# Patient Record
Sex: Female | Born: 1955 | Race: Black or African American | Hispanic: No | Marital: Single | Smoking: Never smoker
Health system: Southern US, Community
[De-identification: ages and names within clinical notes are randomized; demographics above are authoritative.]

## PROBLEM LIST (undated history)

## (undated) DIAGNOSIS — E119 Type 2 diabetes mellitus without complications: Secondary | ICD-10-CM

## (undated) DIAGNOSIS — I10 Essential (primary) hypertension: Secondary | ICD-10-CM

## (undated) HISTORY — PX: ABDOMINAL HYSTERECTOMY: SHX81

## (undated) HISTORY — PX: CERVICAL SPINE SURGERY: SHX589

---

## 1998-01-07 ENCOUNTER — Emergency Department (HOSPITAL_COMMUNITY): Admission: EM | Admit: 1998-01-07 | Discharge: 1998-01-07 | Payer: Self-pay | Admitting: Emergency Medicine

## 1999-04-14 ENCOUNTER — Ambulatory Visit (HOSPITAL_COMMUNITY): Admission: RE | Admit: 1999-04-14 | Discharge: 1999-04-14 | Payer: Self-pay | Admitting: Neurosurgery

## 1999-04-14 ENCOUNTER — Encounter: Payer: Self-pay | Admitting: Neurosurgery

## 1999-05-16 ENCOUNTER — Encounter: Admission: RE | Admit: 1999-05-16 | Discharge: 1999-06-19 | Payer: Self-pay | Admitting: Neurological Surgery

## 1999-06-02 ENCOUNTER — Encounter: Payer: Self-pay | Admitting: Neurological Surgery

## 1999-06-02 ENCOUNTER — Ambulatory Visit (HOSPITAL_COMMUNITY): Admission: RE | Admit: 1999-06-02 | Discharge: 1999-06-02 | Payer: Self-pay | Admitting: Neurological Surgery

## 1999-06-09 ENCOUNTER — Encounter: Payer: Self-pay | Admitting: Neurological Surgery

## 1999-06-12 ENCOUNTER — Inpatient Hospital Stay (HOSPITAL_COMMUNITY): Admission: RE | Admit: 1999-06-12 | Discharge: 1999-06-13 | Payer: Self-pay | Admitting: Neurological Surgery

## 1999-06-12 ENCOUNTER — Encounter: Payer: Self-pay | Admitting: Neurological Surgery

## 1999-07-05 ENCOUNTER — Encounter: Admission: RE | Admit: 1999-07-05 | Discharge: 1999-07-05 | Payer: Self-pay | Admitting: Neurological Surgery

## 1999-07-05 ENCOUNTER — Encounter: Payer: Self-pay | Admitting: Neurological Surgery

## 1999-09-22 ENCOUNTER — Encounter: Payer: Self-pay | Admitting: Neurological Surgery

## 1999-09-22 ENCOUNTER — Ambulatory Visit (HOSPITAL_COMMUNITY): Admission: RE | Admit: 1999-09-22 | Discharge: 1999-09-22 | Payer: Self-pay | Admitting: Neurological Surgery

## 1999-09-29 ENCOUNTER — Encounter: Admission: RE | Admit: 1999-09-29 | Discharge: 1999-11-02 | Payer: Self-pay | Admitting: Neurological Surgery

## 2000-11-22 ENCOUNTER — Encounter: Payer: Self-pay | Admitting: Neurological Surgery

## 2000-11-22 ENCOUNTER — Encounter: Admission: RE | Admit: 2000-11-22 | Discharge: 2000-11-22 | Payer: Self-pay | Admitting: Neurological Surgery

## 2000-12-16 ENCOUNTER — Encounter: Payer: Self-pay | Admitting: Neurological Surgery

## 2000-12-16 ENCOUNTER — Encounter: Admission: RE | Admit: 2000-12-16 | Discharge: 2000-12-16 | Payer: Self-pay | Admitting: Neurological Surgery

## 2014-10-21 ENCOUNTER — Other Ambulatory Visit: Payer: Self-pay | Admitting: Nurse Practitioner

## 2014-10-21 ENCOUNTER — Ambulatory Visit
Admission: RE | Admit: 2014-10-21 | Discharge: 2014-10-21 | Disposition: A | Discharge status: Home / Self Care | Payer: 59 | Source: Ambulatory Visit | Attending: Nurse Practitioner | Admitting: Nurse Practitioner

## 2014-10-21 DIAGNOSIS — R200 Anesthesia of skin: Secondary | ICD-10-CM

## 2016-01-20 ENCOUNTER — Other Ambulatory Visit: Payer: Self-pay | Admitting: Internal Medicine

## 2016-01-20 DIAGNOSIS — Z1231 Encounter for screening mammogram for malignant neoplasm of breast: Secondary | ICD-10-CM

## 2016-02-14 ENCOUNTER — Ambulatory Visit: Payer: 59

## 2019-07-10 ENCOUNTER — Ambulatory Visit: Payer: Medicare Other | Attending: Internal Medicine

## 2019-07-10 DIAGNOSIS — Z23 Encounter for immunization: Secondary | ICD-10-CM

## 2019-07-10 NOTE — Progress Notes (Signed)
   Covid-19 Vaccination Clinic  Name:  AIGNER HORSEMAN    MRN: [id number] DOB: November 09, 1955  07/10/2019  Ms. Stayer was observed post Covid-19 immunization for 15 minutes without incident. She was provided with Vaccine Information Sheet and instruction to access the V-Safe system.   Ms. Huq was instructed to call 911 with any severe reactions post vaccine: Marland Kitchen Difficulty breathing  . Swelling of face and throat  . A fast heartbeat  . A bad rash all over body  . Dizziness and weakness   Immunizations Administered    Name Date Dose VIS Date Route   Pfizer COVID-19 Vaccine 07/10/2019  5:46 PM 0.3 mL 04/17/2019 Intramuscular   Manufacturer: [employer], [employer]   Lot: [id number]   NDC: [address]-6

## 2019-08-11 ENCOUNTER — Ambulatory Visit: Payer: Medicare Other | Attending: Internal Medicine

## 2019-08-11 DIAGNOSIS — Z23 Encounter for immunization: Secondary | ICD-10-CM

## 2019-08-11 NOTE — Progress Notes (Signed)
   Covid-19 Vaccination Clinic  Name:  ROSE-MARIE HICKLING    MRN: [id number] DOB: 02-05-1956  08/11/2019  Ms. Cross was observed post Covid-19 immunization for 15 minutes without incident. She was provided with Vaccine Information Sheet and instruction to access the V-Safe system.   Ms. Dias was instructed to call 911 with any severe reactions post vaccine: Marland Kitchen Difficulty breathing  . Swelling of face and throat  . A fast heartbeat  . A bad rash all over body  . Dizziness and weakness   Immunizations Administered    Name Date Dose VIS Date Route   Pfizer COVID-19 Vaccine 08/11/2019  3:34 PM 0.3 mL 04/17/2019 Intramuscular   Manufacturer: [employer], [employer]   Lot: [id number]   NDC: [address]-7

## 2020-09-02 ENCOUNTER — Other Ambulatory Visit: Payer: Self-pay | Admitting: Internal Medicine

## 2020-09-02 ENCOUNTER — Other Ambulatory Visit: Payer: Self-pay

## 2020-09-02 ENCOUNTER — Ambulatory Visit
Admission: RE | Admit: 2020-09-02 | Discharge: 2020-09-02 | Disposition: A | Discharge status: Home / Self Care | Payer: 59 | Source: Ambulatory Visit | Attending: Internal Medicine | Admitting: Internal Medicine

## 2020-09-02 DIAGNOSIS — M5416 Radiculopathy, lumbar region: Secondary | ICD-10-CM

## 2020-09-14 ENCOUNTER — Other Ambulatory Visit: Payer: Self-pay | Admitting: Internal Medicine

## 2020-09-14 DIAGNOSIS — M5416 Radiculopathy, lumbar region: Secondary | ICD-10-CM

## 2020-09-25 ENCOUNTER — Other Ambulatory Visit: Payer: Self-pay

## 2020-09-25 ENCOUNTER — Ambulatory Visit
Admission: RE | Admit: 2020-09-25 | Discharge: 2020-09-25 | Disposition: A | Discharge status: Home / Self Care | Payer: 59 | Source: Ambulatory Visit | Attending: Internal Medicine | Admitting: Internal Medicine

## 2020-09-25 DIAGNOSIS — M5416 Radiculopathy, lumbar region: Secondary | ICD-10-CM

## 2020-09-25 MED ORDER — GADOBENATE DIMEGLUMINE 529 MG/ML IV SOLN
15.0000 mL | Freq: Once | INTRAVENOUS | Status: AC | PRN
  Administered 2020-09-25: 15 mL via INTRAVENOUS

## 2020-12-27 ENCOUNTER — Other Ambulatory Visit: Payer: Self-pay | Admitting: Neurological Surgery

## 2021-01-13 NOTE — Pre-Procedure Instructions (Signed)
Surgical Instructions    Your procedure is scheduled on Thursday, January 19, 2021 at 7:30 AM.  Report to [facility] Main Entrance "A" at 5:30 A.M., then check in with the Admitting office.  Call this number if you have problems the morning of surgery:  [phone]   If you have any questions prior to your surgery date call [phone]: Open Monday-Friday 8am-4pm    Remember:  Do not eat after midnight the night before your surgery  You may drink clear liquids until 4:30 AM the morning of your surgery.   Clear liquids allowed are: Water, Non-Citrus Juices (without pulp), Carbonated Beverages, Clear Tea, Black Coffee ONLY (NO MILK, CREAM OR POWDERED CREAMER of any kind), and Gatorade    Take these medicines the morning of surgery with A SIP OF WATER:  acetaminophen (TYLENOL) - if needed   As of today, STOP taking any Aspirin (unless otherwise instructed by your surgeon) Aleve, Naproxen, Ibuprofen, Motrin, Advil, Goody's, BC's, all herbal medications, fish oil, and all vitamins.  WHAT DO I DO ABOUT MY DIABETES MEDICATION?   Do not take SYNJARDY XR  the morning of surgery.   HOW TO MANAGE YOUR DIABETES BEFORE AND AFTER SURGERY  Why is it important to control my blood sugar before and after surgery? Improving blood sugar levels before and after surgery helps healing and can limit problems. A way of improving blood sugar control is eating a healthy diet by:  Eating less sugar and carbohydrates  Increasing activity/exercise  Talking with your doctor about reaching your blood sugar goals High blood sugars (greater than 180 mg/dL) can raise your risk of infections and slow your recovery, so you will need to focus on controlling your diabetes during the weeks before surgery. Make sure that the doctor who takes care of your diabetes knows about your planned surgery including the date and location.  How do I manage my blood sugar before surgery? Check your blood sugar at least 4  times a day, starting 2 days before surgery, to make sure that the level is not too high or low.  Check your blood sugar the morning of your surgery when you wake up and every 2 hours until you get to the Short Stay unit.  If your blood sugar is less than 70 mg/dL, you will need to treat for low blood sugar: Do not take insulin. Treat a low blood sugar (less than 70 mg/dL) with  cup of clear juice (cranberry or apple), 4 glucose tablets, OR glucose gel. Recheck blood sugar in 15 minutes after treatment (to make sure it is greater than 70 mg/dL). If your blood sugar is not greater than 70 mg/dL on recheck, call [phone] for further instructions. Report your blood sugar to the short stay nurse when you get to Short Stay.  If you are admitted to the hospital after surgery: Your blood sugar will be checked by the staff and you will probably be given insulin after surgery (instead of oral diabetes medicines) to make sure you have good blood sugar levels. The goal for blood sugar control after surgery is 80-180 mg/dL.           Do not wear jewelry or makeup Do not wear lotions, powders, perfumes, or deodorant. Do not shave 48 hours prior to surgery. Do not bring valuables to the hospital. DO Not wear nail polish, gel polish, artificial nails, or any other type of covering on natural nails including finger and toenails. If patients have artificial nails,  gel coating, etc. that need to be removed by a nail salon please have this removed prior to surgery or surgery may need to be canceled/delayed if the surgeon/ anesthesia feels like the patient is unable to be adequately monitored.             [facility] is not responsible for any belongings or valuables.  Do NOT Smoke (Tobacco/Vaping)  24 hours prior to your procedure If you use a CPAP at night, you may bring your mask for your overnight stay.   Contacts, glasses, dentures or bridgework may not be worn into surgery, please bring cases for  these belongings   For patients admitted to the hospital, discharge time will be determined by your treatment team.   Patients discharged the day of surgery will not be allowed to drive home, and someone needs to stay with them for 24 hours.  ONLY 1 SUPPORT PERSON MAY BE PRESENT WHILE YOU ARE IN SURGERY. IF YOU ARE TO BE ADMITTED ONCE YOU ARE IN YOUR ROOM YOU WILL BE ALLOWED TWO (2) VISITORS.  Minor children may have two parents present. Special consideration for safety and communication needs will be reviewed on a case by case basis.  Special instructions:    Oral Hygiene is also important to reduce your risk of infection.  Remember - BRUSH YOUR TEETH THE MORNING OF SURGERY WITH YOUR REGULAR TOOTHPASTE   [facility]- Preparing For Surgery  Before surgery, you can play an important role. Because skin is not sterile, your skin needs to be as free of germs as possible. You can reduce the number of germs on your skin by washing with CHG (chlorahexidine gluconate) Soap before surgery.  CHG is an antiseptic cleaner which kills germs and bonds with the skin to continue killing germs even after washing.     Please do not use if you have an allergy to CHG or antibacterial soaps. If your skin becomes reddened/irritated stop using the CHG.  Do not shave (including legs and underarms) for at least 48 hours prior to first CHG shower. It is OK to shave your face.  Please follow these instructions carefully.     Shower the NIGHT BEFORE SURGERY and the MORNING OF SURGERY with CHG Soap.   If you chose to wash your hair, wash your hair first as usual with your normal shampoo. After you shampoo, rinse your hair and body thoroughly to remove the shampoo.  Then [employer] and genitals (private parts) with your normal soap and rinse thoroughly to remove soap.  After that Use CHG Soap as you would any other liquid soap. You can apply CHG directly to the skin and wash gently with a scrungie or a clean washcloth.    Apply the CHG Soap to your body ONLY FROM THE NECK DOWN.  Do not use on open wounds or open sores. Avoid contact with your eyes, ears, mouth and genitals (private parts). Wash Face and genitals (private parts)  with your normal soap.   Wash thoroughly, paying special attention to the area where your surgery will be performed.  Thoroughly rinse your body with warm water from the neck down.  DO NOT shower/wash with your normal soap after using and rinsing off the CHG Soap.  Pat yourself dry with a CLEAN TOWEL.  Wear CLEAN PAJAMAS to bed the night before surgery  Place CLEAN SHEETS on your bed the night before your surgery  DO NOT SLEEP WITH PETS.   Day of Surgery:  Take a shower with CHG soap. Wear Clean/Comfortable clothing the morning of surgery Do not apply any deodorants/lotions.   Remember to brush your teeth WITH YOUR REGULAR TOOTHPASTE.   Please read over the following fact sheets that you were given.

## 2021-01-16 ENCOUNTER — Encounter (HOSPITAL_COMMUNITY): Payer: Self-pay

## 2021-01-16 ENCOUNTER — Encounter (HOSPITAL_COMMUNITY)
Admission: RE | Admit: 2021-01-16 | Discharge: 2021-01-16 | Disposition: A | Discharge status: Home / Self Care | Payer: 59 | Source: Ambulatory Visit | Attending: Neurological Surgery | Admitting: Neurological Surgery

## 2021-01-16 ENCOUNTER — Other Ambulatory Visit: Payer: Self-pay

## 2021-01-16 DIAGNOSIS — Z20822 Contact with and (suspected) exposure to covid-19: Secondary | ICD-10-CM | POA: Insufficient documentation

## 2021-01-16 DIAGNOSIS — Z01818 Encounter for other preprocedural examination: Secondary | ICD-10-CM | POA: Diagnosis present

## 2021-01-16 DIAGNOSIS — E119 Type 2 diabetes mellitus without complications: Secondary | ICD-10-CM | POA: Diagnosis not present

## 2021-01-16 DIAGNOSIS — I10 Essential (primary) hypertension: Secondary | ICD-10-CM | POA: Insufficient documentation

## 2021-01-16 HISTORY — DX: Essential (primary) hypertension: I10

## 2021-01-16 HISTORY — DX: Type 2 diabetes mellitus without complications: E11.9

## 2021-01-16 LAB — GLUCOSE, CAPILLARY: Glucose-Capillary: 142 mg/dL — ABNORMAL HIGH (ref 70–99)

## 2021-01-16 LAB — TYPE AND SCREEN
ABO/RH(D): A POS
Antibody Screen: NEGATIVE

## 2021-01-16 LAB — BASIC METABOLIC PANEL WITH GFR
Anion gap: 7 (ref 5–15)
BUN: 14 mg/dL (ref 8–23)
CO2: 26 mmol/L (ref 22–32)
Calcium: 10.7 mg/dL — ABNORMAL HIGH (ref 8.9–10.3)
Chloride: 106 mmol/L (ref 98–111)
Creatinine, Ser: 0.91 mg/dL (ref 0.44–1.00)
GFR, Estimated: >60 mL/min
Glucose, Bld: 135 mg/dL — ABNORMAL HIGH (ref 70–99)
Potassium: 4.1 mmol/L (ref 3.5–5.1)
Sodium: 139 mmol/L (ref 135–145)

## 2021-01-16 LAB — CBC
HCT: 45.8 % (ref 36.0–46.0)
Hemoglobin: 15 g/dL (ref 12.0–15.0)
MCH: 29.9 pg (ref 26.0–34.0)
MCHC: 32.8 g/dL (ref 30.0–36.0)
MCV: 91.2 fL (ref 80.0–100.0)
Platelets: 328 K/uL (ref 150–400)
RBC: 5.02 MIL/uL (ref 3.87–5.11)
RDW: 12.9 % (ref 11.5–15.5)
WBC: 4.9 K/uL (ref 4.0–10.5)
nRBC: 0 % (ref 0.0–0.2)

## 2021-01-16 LAB — HEMOGLOBIN A1C
Hgb A1c MFr Bld: 7.2 % — ABNORMAL HIGH (ref 4.8–5.6)
Mean Plasma Glucose: 159.94 mg/dL

## 2021-01-16 LAB — SURGICAL PCR SCREEN
MRSA, PCR: NEGATIVE
Staphylococcus aureus: POSITIVE — AB

## 2021-01-16 LAB — SARS CORONAVIRUS 2 (TAT 6-24 HRS): SARS Coronavirus 2: NEGATIVE

## 2021-01-16 NOTE — Progress Notes (Addendum)
PCP - Dr. Marden Noble Cardiologist - Denies  PPM/ICD - Denies  Chest x-ray - N/A EKG - 01/16/21 Stress Test - Pt is not sure. ECHO - Denies Cardiac Cath - Denies  Sleep Study - Denies   Fasting Blood Sugar - <100 Checks Blood Sugar __2___ times a day  Blood Thinner Instructions: N/A Aspirin Instructions: N/A  ERAS Protcol - Yes PRE-SURGERY Ensure or G2- No  COVID TEST- 01/16/21 - done in PAT  Patients BP elevated before and after PAT appt. (172/107 and 149/103). Patient states that she has not taken any of her medications today. Patient encouraged to take her medications as prescribed.   Anesthesia review: Yes, records requested from [facility]  Patient denies shortness of breath, fever, cough and chest pain at PAT appointment   All instructions explained to the patient, with a verbal understanding of the material. Patient agrees to go over the instructions while at home for a better understanding. Patient also instructed to self quarantine after being tested for COVID-19. The opportunity to ask questions was provided.

## 2021-01-17 NOTE — Progress Notes (Signed)
Anesthesia Chart Review:  Case: [id number] Date/Time: 01/19/21 0715   Procedure: Lumbar 4-5, Lumbar 5-Sacral 1 Posterior lumbar interbody fusion (Back) - 3C/RM 20   Anesthesia type: General   Pre-op diagnosis: Spondylolisthesis   Location: [facility] OR ROOM 20 / [facility] OR   Surgeons: Barnett Abu, MD       DISCUSSION: Patient is a 65 year old female scheduled for the above procedure.  History includes never smoker, HTN, DM2, c-spine surgery (C4-7 ACDF by imaging). BMI is consistent with obesity.   BP 149/103 at PAT, but had not taken her irbesartan-HCTZ yet. Previous BP reading in April 2022 ~ 120's-130/'s/70-80's. She denied SOB and chest pain per PAT RN interview.  EKG showed NSR, non-specific T wave abnormality. Preoperative labs acceptable. A1c 7.2%, down from 7.8% on 08/05/20. She reported fasting CBGs < 100.  01/16/2021 presurgical COVID-19 test negative.  Anesthesia team to evaluate on the day of surgery.   VS: BP (!) 149/103   Pulse 90   Temp 37.1 C (Oral)   Resp 17   Ht 5\' 2"  (1.575 m)   Wt 83.2 kg   SpO2 100%   BMI 33.54 kg/m  BP 139/80 on 09/02/20 and 126/78 on 08/05/20 at Dr. 10/05/20' office.    PROVIDERS: Kevan Ny, MD is PCP Marden Noble IM- [facility])   LABS: Labs reviewed: Acceptable for surgery. (all labs ordered are listed, but only abnormal results are displayed)  Labs Reviewed  SURGICAL PCR SCREEN - Abnormal; Notable for the following components:      Result Value   Staphylococcus aureus POSITIVE (*)    All other components within normal limits  GLUCOSE, CAPILLARY - Abnormal; Notable for the following components:   Glucose-Capillary 142 (*)    All other components within normal limits  BASIC METABOLIC PANEL - Abnormal; Notable for the following components:   Glucose, Bld 135 (*)    Calcium 10.7 (*)    All other components within normal limits  HEMOGLOBIN A1C - Abnormal; Notable for the following components:   Hgb A1c MFr Bld 7.2 (*)    All other components within  normal limits  SARS CORONAVIRUS 2 (TAT 6-24 HRS)  CBC  TYPE AND SCREEN    IMAGES: MRI L-spine 09/25/20: IMPRESSION: 1. 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 Superimposed lower thoracic (T11-T12) and lumbar (L3-L4 through L5-S1) disc bulging. 2. At L5-S1 a right side degenerative Synovial Cyst (7 mm) projects into the spinal canal and right lateral recess with moderate spinal and lateral recess stenosis. Query Right S1 radiculitis. Associated moderate bilateral L5 foraminal stenosis. 3. Mild to moderate spinal stenosis at L4-L5 with severe left and moderate right L4 neural foraminal stenosis. 4. Mild spinal stenosis at L3-L4 with up to severe right and moderate left L3 neural foraminal stenosis. 5. Mild lower thoracic spinal stenosis at T11-T12 with moderate foraminal stenosis.    EKG: 01/16/21: Normal sinus rhythm Nonspecific T wave abnormality Abnormal ECG   CV: Remote history of ETT in 1998.   Past Medical History:  Diagnosis Date   Diabetes mellitus without complication (HCC)    Hypertension     Past Surgical History:  Procedure Laterality Date   ABDOMINAL HYSTERECTOMY     CERVICAL SPINE SURGERY      MEDICATIONS:  acetaminophen (TYLENOL) 500 MG tablet   irbesartan-hydrochlorothiazide (AVALIDE) 150-12.5 MG tablet   rosuvastatin (CRESTOR) 20 MG tablet   SYNJARDY XR 12.09-998 MG TB24   No current facility-administered medications for this encounter.  Shonna Chock, PA-C Surgical Short Stay/Anesthesiology [facility] Phone [phone] [facility] Phone [phone] 01/17/2021 2:17 PM

## 2021-01-17 NOTE — Anesthesia Preprocedure Evaluation (Addendum)
Anesthesia Evaluation  Patient identified by MRN, date of birth, ID band Patient awake    Reviewed: Allergy & Precautions, NPO status , Patient's Chart, lab work & pertinent test results  Airway Mallampati: II  TM Distance: >3 FB Neck ROM: Full    Dental no notable dental hx.    Pulmonary neg pulmonary ROS,    Pulmonary exam normal breath sounds clear to auscultation       Cardiovascular hypertension, Pt. on medications Normal cardiovascular exam Rhythm:Regular Rate:Normal     Neuro/Psych negative neurological ROS  negative psych ROS   GI/Hepatic negative GI ROS, Neg liver ROS,   Endo/Other  diabetes  Renal/GU negative Renal ROS  negative genitourinary   Musculoskeletal negative musculoskeletal ROS (+)   Abdominal   Peds negative pediatric ROS (+)  Hematology negative hematology ROS (+)   Anesthesia Other Findings   Reproductive/Obstetrics negative OB ROS                            Anesthesia Physical Anesthesia Plan  ASA: 2  Anesthesia Plan: General   Post-op Pain Management:    Induction: Intravenous  PONV Risk Score and Plan: 3 and Ondansetron, Dexamethasone and Treatment may vary due to age or medical condition  Airway Management Planned: Oral ETT  Additional Equipment:   Intra-op Plan:   Post-operative Plan: Extubation in OR  Informed Consent: I have reviewed the patients History and Physical, chart, labs and discussed the procedure including the risks, benefits and alternatives for the proposed anesthesia with the patient or authorized representative who has indicated his/her understanding and acceptance.     Dental advisory given  Plan Discussed with: CRNA and Surgeon  Anesthesia Plan Comments: (PAT note written 01/17/2021 by Shonna Chock, PA-C. )       Anesthesia Quick Evaluation

## 2021-01-19 ENCOUNTER — Inpatient Hospital Stay (HOSPITAL_COMMUNITY): Payer: 59

## 2021-01-19 ENCOUNTER — Encounter (HOSPITAL_COMMUNITY)
Admission: RE | Disposition: A | Discharge status: Home / Self Care | Payer: Self-pay | Source: Home / Self Care | Attending: Neurological Surgery

## 2021-01-19 ENCOUNTER — Encounter (HOSPITAL_COMMUNITY): Payer: Self-pay | Admitting: Neurological Surgery

## 2021-01-19 ENCOUNTER — Inpatient Hospital Stay (HOSPITAL_COMMUNITY): Payer: 59 | Admitting: Physician Assistant

## 2021-01-19 ENCOUNTER — Inpatient Hospital Stay (HOSPITAL_COMMUNITY): Payer: 59 | Admitting: Anesthesiology

## 2021-01-19 ENCOUNTER — Inpatient Hospital Stay (HOSPITAL_COMMUNITY)
Admission: RE | Admit: 2021-01-19 | Discharge: 2021-01-20 | DRG: 455 | Disposition: A | Discharge status: Home / Self Care | Payer: 59 | Attending: Neurological Surgery | Admitting: Neurological Surgery

## 2021-01-19 ENCOUNTER — Other Ambulatory Visit: Payer: Self-pay

## 2021-01-19 DIAGNOSIS — I10 Essential (primary) hypertension: Secondary | ICD-10-CM | POA: Diagnosis present

## 2021-01-19 DIAGNOSIS — M4316 Spondylolisthesis, lumbar region: Secondary | ICD-10-CM | POA: Diagnosis present

## 2021-01-19 DIAGNOSIS — Z79899 Other long term (current) drug therapy: Secondary | ICD-10-CM

## 2021-01-19 DIAGNOSIS — E119 Type 2 diabetes mellitus without complications: Secondary | ICD-10-CM | POA: Diagnosis present

## 2021-01-19 DIAGNOSIS — M48061 Spinal stenosis, lumbar region without neurogenic claudication: Principal | ICD-10-CM | POA: Diagnosis present

## 2021-01-19 DIAGNOSIS — M5416 Radiculopathy, lumbar region: Secondary | ICD-10-CM | POA: Diagnosis present

## 2021-01-19 DIAGNOSIS — Z419 Encounter for procedure for purposes other than remedying health state, unspecified: Secondary | ICD-10-CM

## 2021-01-19 LAB — GLUCOSE, CAPILLARY
Glucose-Capillary: 146 mg/dL — ABNORMAL HIGH (ref 70–99)
Glucose-Capillary: 140 mg/dL — ABNORMAL HIGH (ref 70–99)
Glucose-Capillary: 218 mg/dL — ABNORMAL HIGH (ref 70–99)

## 2021-01-19 LAB — ABO/RH: ABO/RH(D): A POS

## 2021-01-19 SURGERY — POSTERIOR LUMBAR FUSION 2 LEVEL
Anesthesia: General | Site: Back

## 2021-01-19 MED ORDER — CHLORHEXIDINE GLUCONATE CLOTH 2 % EX PADS: 6.0000 | MEDICATED_PAD | Freq: Once | CUTANEOUS | Status: DC

## 2021-01-19 MED ORDER — OXYCODONE-ACETAMINOPHEN 5-325 MG PO TABS
1.0000 | ORAL_TABLET | ORAL | Status: DC | PRN
  Administered 2021-01-19 – 2021-01-20 (×5): 2 via ORAL
  Filled 2021-01-19 (×5): qty 2

## 2021-01-19 MED ORDER — IRBESARTAN 150 MG PO TABS
150.0000 mg | ORAL_TABLET | Freq: Every day | ORAL | Status: DC
  Administered 2021-01-20: 150 mg via ORAL
  Filled 2021-01-19: qty 1

## 2021-01-19 MED ORDER — DIPHENHYDRAMINE HCL 50 MG/ML IJ SOLN
INTRAMUSCULAR | Status: DC | PRN
  Administered 2021-01-19: 12.5 mg via INTRAVENOUS

## 2021-01-19 MED ORDER — BISACODYL 10 MG RE SUPP: 10.0000 mg | Freq: Every day | RECTAL | Status: DC | PRN

## 2021-01-19 MED ORDER — HYDROMORPHONE HCL 1 MG/ML IJ SOLN
INTRAMUSCULAR | Status: AC
  Filled 2021-01-19: qty 1

## 2021-01-19 MED ORDER — LIDOCAINE-EPINEPHRINE 1 %-1:100000 IJ SOLN
INTRAMUSCULAR | Status: DC | PRN
  Administered 2021-01-19: 5 mL

## 2021-01-19 MED ORDER — PHENYLEPHRINE HCL (PRESSORS) 10 MG/ML IV SOLN
INTRAVENOUS | Status: DC | PRN
  Administered 2021-01-19: 30 ug via INTRAVENOUS

## 2021-01-19 MED ORDER — MIDAZOLAM HCL 2 MG/2ML IJ SOLN
INTRAMUSCULAR | Status: DC | PRN
  Administered 2021-01-19: 2 mg via INTRAVENOUS

## 2021-01-19 MED ORDER — ONDANSETRON HCL 4 MG/2ML IJ SOLN
INTRAMUSCULAR | Status: DC | PRN
  Administered 2021-01-19: 4 mg via INTRAVENOUS

## 2021-01-19 MED ORDER — CEFAZOLIN SODIUM-DEXTROSE 2-4 GM/100ML-% IV SOLN
2.0000 g | Freq: Three times a day (TID) | INTRAVENOUS | Status: AC
  Administered 2021-01-19 (×2): 2 g via INTRAVENOUS
  Filled 2021-01-19 (×2): qty 100

## 2021-01-19 MED ORDER — CEFAZOLIN SODIUM-DEXTROSE 2-4 GM/100ML-% IV SOLN
2.0000 g | INTRAVENOUS | Status: AC
  Administered 2021-01-19: 2 g via INTRAVENOUS
  Filled 2021-01-19: qty 100

## 2021-01-19 MED ORDER — ACETAMINOPHEN 10 MG/ML IV SOLN
INTRAVENOUS | Status: AC
  Filled 2021-01-19: qty 100

## 2021-01-19 MED ORDER — IRBESARTAN-HYDROCHLOROTHIAZIDE 150-12.5 MG PO TABS: 1.0000 | ORAL_TABLET | Freq: Every day | ORAL | Status: DC

## 2021-01-19 MED ORDER — LACTATED RINGERS IV SOLN: INTRAVENOUS | Status: DC

## 2021-01-19 MED ORDER — MORPHINE SULFATE (PF) 2 MG/ML IV SOLN
2.0000 mg | INTRAVENOUS | Status: DC | PRN
  Administered 2021-01-19: 4 mg via INTRAVENOUS
  Filled 2021-01-19: qty 2

## 2021-01-19 MED ORDER — ACETAMINOPHEN 650 MG RE SUPP: 650.0000 mg | RECTAL | Status: DC | PRN

## 2021-01-19 MED ORDER — SUCCINYLCHOLINE CHLORIDE 200 MG/10ML IV SOSY
PREFILLED_SYRINGE | INTRAVENOUS | Status: DC | PRN
  Administered 2021-01-19: 140 mg via INTRAVENOUS

## 2021-01-19 MED ORDER — PHENOL 1.4 % MT LIQD: 1.0000 | OROMUCOSAL | Status: DC | PRN

## 2021-01-19 MED ORDER — ACETAMINOPHEN 10 MG/ML IV SOLN: 1000.0000 mg | Freq: Once | INTRAVENOUS | Status: DC | PRN

## 2021-01-19 MED ORDER — ACETAMINOPHEN 325 MG PO TABS: 650.0000 mg | ORAL_TABLET | ORAL | Status: DC | PRN

## 2021-01-19 MED ORDER — PROPOFOL 10 MG/ML IV BOLUS
INTRAVENOUS | Status: DC | PRN
  Administered 2021-01-19: 70 mg via INTRAVENOUS

## 2021-01-19 MED ORDER — THROMBIN 20000 UNITS EX SOLR
CUTANEOUS | Status: AC
  Filled 2021-01-19: qty 20000

## 2021-01-19 MED ORDER — LIDOCAINE-EPINEPHRINE 1 %-1:100000 IJ SOLN
INTRAMUSCULAR | Status: AC
  Filled 2021-01-19: qty 1

## 2021-01-19 MED ORDER — THROMBIN 20000 UNITS EX SOLR
CUTANEOUS | Status: DC | PRN
  Administered 2021-01-19: 20 mL via TOPICAL

## 2021-01-19 MED ORDER — PROPOFOL 10 MG/ML IV BOLUS
INTRAVENOUS | Status: AC
  Filled 2021-01-19: qty 20

## 2021-01-19 MED ORDER — PHENYLEPHRINE HCL-NACL 20-0.9 MG/250ML-% IV SOLN
INTRAVENOUS | Status: DC | PRN
  Administered 2021-01-19: 30 ug/min via INTRAVENOUS

## 2021-01-19 MED ORDER — EMPAGLIFLOZIN-METFORMIN HCL ER 12.5-1000 MG PO TB24: 1.0000 | ORAL_TABLET | Freq: Every day | ORAL | Status: DC

## 2021-01-19 MED ORDER — FENTANYL CITRATE (PF) 250 MCG/5ML IJ SOLN
INTRAMUSCULAR | Status: AC
  Filled 2021-01-19: qty 5

## 2021-01-19 MED ORDER — ALUM & MAG HYDROXIDE-SIMETH 200-200-20 MG/5ML PO SUSP: 30.0000 mL | Freq: Four times a day (QID) | ORAL | Status: DC | PRN

## 2021-01-19 MED ORDER — ROCURONIUM BROMIDE 10 MG/ML (PF) SYRINGE
PREFILLED_SYRINGE | INTRAVENOUS | Status: DC | PRN
  Administered 2021-01-19: 30 mg via INTRAVENOUS
  Administered 2021-01-19: 20 mg via INTRAVENOUS
  Administered 2021-01-19: 40 mg via INTRAVENOUS

## 2021-01-19 MED ORDER — METHOCARBAMOL 500 MG PO TABS
500.0000 mg | ORAL_TABLET | Freq: Four times a day (QID) | ORAL | Status: DC | PRN
  Administered 2021-01-19 – 2021-01-20 (×3): 500 mg via ORAL
  Filled 2021-01-19 (×3): qty 1

## 2021-01-19 MED ORDER — CHLORHEXIDINE GLUCONATE 0.12 % MT SOLN
15.0000 mL | Freq: Once | OROMUCOSAL | Status: AC
  Administered 2021-01-19: 15 mL via OROMUCOSAL
  Filled 2021-01-19: qty 15

## 2021-01-19 MED ORDER — SUGAMMADEX SODIUM 200 MG/2ML IV SOLN
INTRAVENOUS | Status: DC | PRN
  Administered 2021-01-19: 200 mg via INTRAVENOUS

## 2021-01-19 MED ORDER — HYDROMORPHONE HCL 1 MG/ML IJ SOLN
0.2500 mg | INTRAMUSCULAR | Status: DC | PRN
  Administered 2021-01-19 (×3): 0.25 mg via INTRAVENOUS

## 2021-01-19 MED ORDER — POLYVINYL ALCOHOL 1.4 % OP SOLN
1.0000 [drp] | OPHTHALMIC | Status: DC | PRN
  Administered 2021-01-20: 1 [drp] via OPHTHALMIC
  Filled 2021-01-19: qty 15

## 2021-01-19 MED ORDER — DEXAMETHASONE SODIUM PHOSPHATE 10 MG/ML IJ SOLN
INTRAMUSCULAR | Status: DC | PRN
Start: 2021-01-19 — End: 2021-01-19
  Administered 2021-01-19: 5 mg via INTRAVENOUS

## 2021-01-19 MED ORDER — SODIUM CHLORIDE 0.9 % IV SOLN: 250.0000 mL | INTRAVENOUS | Status: DC

## 2021-01-19 MED ORDER — FENTANYL CITRATE (PF) 250 MCG/5ML IJ SOLN
INTRAMUSCULAR | Status: DC | PRN
  Administered 2021-01-19 (×2): 50 ug via INTRAVENOUS
  Administered 2021-01-19: 25 ug via INTRAVENOUS
  Administered 2021-01-19: 50 ug via INTRAVENOUS
  Administered 2021-01-19: 25 ug via INTRAVENOUS
  Administered 2021-01-19: 50 ug via INTRAVENOUS

## 2021-01-19 MED ORDER — ACETAMINOPHEN 10 MG/ML IV SOLN
INTRAVENOUS | Status: DC | PRN
  Administered 2021-01-19: 1000 mg via INTRAVENOUS

## 2021-01-19 MED ORDER — METHOCARBAMOL 1000 MG/10ML IJ SOLN: 500.0000 mg | Freq: Four times a day (QID) | INTRAVENOUS | Status: DC | PRN

## 2021-01-19 MED ORDER — HYDROMORPHONE HCL 1 MG/ML IJ SOLN
INTRAMUSCULAR | Status: AC
  Filled 2021-01-19: qty 0.5

## 2021-01-19 MED ORDER — ONDANSETRON HCL 4 MG PO TABS: 4.0000 mg | ORAL_TABLET | Freq: Four times a day (QID) | ORAL | Status: DC | PRN

## 2021-01-19 MED ORDER — ONDANSETRON HCL 4 MG/2ML IJ SOLN
4.0000 mg | Freq: Once | INTRAMUSCULAR | Status: DC | PRN
Start: 2021-01-19 — End: 2021-01-19

## 2021-01-19 MED ORDER — HYDROMORPHONE HCL 1 MG/ML IJ SOLN
INTRAMUSCULAR | Status: DC | PRN
  Administered 2021-01-19: .5 mg via INTRAVENOUS

## 2021-01-19 MED ORDER — THROMBIN 5000 UNITS EX SOLR
CUTANEOUS | Status: AC
  Filled 2021-01-19: qty 5000

## 2021-01-19 MED ORDER — MENTHOL 3 MG MT LOZG: 1.0000 | LOZENGE | OROMUCOSAL | Status: DC | PRN

## 2021-01-19 MED ORDER — MIDAZOLAM HCL 2 MG/2ML IJ SOLN
INTRAMUSCULAR | Status: AC
  Filled 2021-01-19: qty 2

## 2021-01-19 MED ORDER — POLYETHYLENE GLYCOL 3350 17 G PO PACK: 17.0000 g | PACK | Freq: Every day | ORAL | Status: DC | PRN

## 2021-01-19 MED ORDER — LIDOCAINE 2% (20 MG/ML) 5 ML SYRINGE
INTRAMUSCULAR | Status: DC | PRN
  Administered 2021-01-19: 100 mg via INTRAVENOUS

## 2021-01-19 MED ORDER — ONDANSETRON HCL 4 MG/2ML IJ SOLN: 4.0000 mg | Freq: Four times a day (QID) | INTRAMUSCULAR | Status: DC | PRN

## 2021-01-19 MED ORDER — BUPIVACAINE HCL (PF) 0.5 % IJ SOLN
INTRAMUSCULAR | Status: AC
  Filled 2021-01-19: qty 30

## 2021-01-19 MED ORDER — HYDROCHLOROTHIAZIDE 12.5 MG PO CAPS
12.5000 mg | ORAL_CAPSULE | Freq: Every day | ORAL | Status: DC
  Administered 2021-01-20: 12.5 mg via ORAL
  Filled 2021-01-19: qty 1

## 2021-01-19 MED ORDER — 0.9 % SODIUM CHLORIDE (POUR BTL) OPTIME
TOPICAL | Status: DC | PRN
  Administered 2021-01-19: 1000 mL

## 2021-01-19 MED ORDER — THROMBIN 5000 UNITS EX SOLR
OROMUCOSAL | Status: DC | PRN
  Administered 2021-01-19: 5 mL via TOPICAL

## 2021-01-19 MED ORDER — SODIUM CHLORIDE 0.9% FLUSH: 3.0000 mL | INTRAVENOUS | Status: DC | PRN

## 2021-01-19 MED ORDER — BUPIVACAINE HCL (PF) 0.5 % IJ SOLN
INTRAMUSCULAR | Status: DC | PRN
  Administered 2021-01-19: 5 mL
  Administered 2021-01-19: 25 mL

## 2021-01-19 MED ORDER — ORAL CARE MOUTH RINSE: 15.0000 mL | Freq: Once | OROMUCOSAL | Status: AC

## 2021-01-19 MED ORDER — DOCUSATE SODIUM 100 MG PO CAPS
100.0000 mg | ORAL_CAPSULE | Freq: Two times a day (BID) | ORAL | Status: DC
  Administered 2021-01-19 – 2021-01-20 (×2): 100 mg via ORAL
  Filled 2021-01-19 (×2): qty 1

## 2021-01-19 MED ORDER — SODIUM CHLORIDE 0.9% FLUSH: 3.0000 mL | Freq: Two times a day (BID) | INTRAVENOUS | Status: DC

## 2021-01-19 MED ORDER — SENNA 8.6 MG PO TABS
1.0000 | ORAL_TABLET | Freq: Two times a day (BID) | ORAL | Status: DC
  Administered 2021-01-19 – 2021-01-20 (×2): 8.6 mg via ORAL
  Filled 2021-01-19 (×2): qty 1

## 2021-01-19 MED ORDER — FLEET ENEMA 7-19 GM/118ML RE ENEM: 1.0000 | ENEMA | Freq: Once | RECTAL | Status: DC | PRN

## 2021-01-19 MED ORDER — ACETAMINOPHEN 500 MG PO TABS: 1000.0000 mg | ORAL_TABLET | Freq: Four times a day (QID) | ORAL | Status: DC | PRN

## 2021-01-19 MED ORDER — ROSUVASTATIN CALCIUM 20 MG PO TABS
20.0000 mg | ORAL_TABLET | Freq: Every day | ORAL | Status: DC
  Administered 2021-01-19: 20 mg via ORAL
  Filled 2021-01-19: qty 1

## 2021-01-19 SURGICAL SUPPLY — 65 items
ADH SKN CLS APL DERMABOND .7 (GAUZE/BANDAGES/DRESSINGS) ×1
APL SRG 60D 8 XTD TIP BNDBL (TIP)
BAG COUNTER SPONGE SURGICOUNT (BAG) ×4 IMPLANT
BAG SPNG CNTER NS LX DISP (BAG) ×2
BASKET BONE COLLECTION (BASKET) ×2 IMPLANT
BLADE CLIPPER SURG (BLADE) IMPLANT
BONE CANC CHIPS 20CC PCAN1/4 (Bone Implant) ×2 IMPLANT
BUR MATCHSTICK NEURO 3.0 LAGG (BURR) ×2 IMPLANT
CAGE COROENT 12X9X23-8 (Cage) ×4 IMPLANT
CAGE PLIF 8X9X23-12 LUMBAR (Cage) ×4 IMPLANT
CANISTER SUCT 3000ML PPV (MISCELLANEOUS) ×2 IMPLANT
CHIPS CANC BONE 20CC PCAN1/4 (Bone Implant) ×1 IMPLANT
CNTNR URN SCR LID CUP LEK RST (MISCELLANEOUS) ×1 IMPLANT
CONT SPEC 4OZ STRL OR WHT (MISCELLANEOUS) ×2
COVER BACK TABLE 60X90IN (DRAPES) ×2 IMPLANT
DECANTER SPIKE VIAL GLASS SM (MISCELLANEOUS) ×2 IMPLANT
DERMABOND ADVANCED (GAUZE/BANDAGES/DRESSINGS) ×1
DERMABOND ADVANCED .7 DNX12 (GAUZE/BANDAGES/DRESSINGS) ×1 IMPLANT
DEVICE DISSECT PLASMABLAD 3.0S (MISCELLANEOUS) ×1 IMPLANT
DRAPE C-ARM 42X72 X-RAY (DRAPES) ×4 IMPLANT
DRAPE HALF SHEET 40X57 (DRAPES) ×2 IMPLANT
DRAPE LAPAROTOMY 100X72X124 (DRAPES) ×2 IMPLANT
DURAPREP 26ML APPLICATOR (WOUND CARE) ×2 IMPLANT
DURASEAL APPLICATOR TIP (TIP) IMPLANT
DURASEAL SPINE SEALANT 3ML (MISCELLANEOUS) IMPLANT
ELECT REM PT RETURN 9FT ADLT (ELECTROSURGICAL) ×2
ELECTRODE REM PT RTRN 9FT ADLT (ELECTROSURGICAL) ×1 IMPLANT
GAUZE 4X4 16PLY ~~LOC~~+RFID DBL (SPONGE) ×2 IMPLANT
GAUZE SPONGE 4X4 12PLY STRL (GAUZE/BANDAGES/DRESSINGS) ×2 IMPLANT
GLOVE SURG LTX SZ8.5 (GLOVE) ×4 IMPLANT
GLOVE SURG UNDER POLY LF SZ8.5 (GLOVE) ×4 IMPLANT
GOWN STRL REUS W/ TWL LRG LVL3 (GOWN DISPOSABLE) ×1 IMPLANT
GOWN STRL REUS W/ TWL XL LVL3 (GOWN DISPOSABLE) ×1 IMPLANT
GOWN STRL REUS W/TWL 2XL LVL3 (GOWN DISPOSABLE) ×4 IMPLANT
GOWN STRL REUS W/TWL LRG LVL3 (GOWN DISPOSABLE) ×2
GOWN STRL REUS W/TWL XL LVL3 (GOWN DISPOSABLE) ×2
GRAFT BONE PROTEIOS LRG 5CC (Orthopedic Implant) ×2 IMPLANT
HEMOSTAT POWDER KIT SURGIFOAM (HEMOSTASIS) ×2 IMPLANT
KIT BASIN OR (CUSTOM PROCEDURE TRAY) ×2 IMPLANT
KIT TURNOVER KIT B (KITS) ×2 IMPLANT
MILL MEDIUM DISP (BLADE) ×2 IMPLANT
NEEDLE HYPO 22GX1.5 SAFETY (NEEDLE) ×2 IMPLANT
NEEDLE SPNL 18GX3.5 QUINCKE PK (NEEDLE) IMPLANT
NS IRRIG 1000ML POUR BTL (IV SOLUTION) ×2 IMPLANT
PACK LAMINECTOMY NEURO (CUSTOM PROCEDURE TRAY) ×2 IMPLANT
PAD ARMBOARD 7.5X6 YLW CONV (MISCELLANEOUS) ×6 IMPLANT
PATTIES SURGICAL .5 X1 (DISPOSABLE) ×2 IMPLANT
PLASMABLADE 3.0S (MISCELLANEOUS) ×2
ROD RELINE-O LORDOTIC 5.5X60MM (Rod) ×4 IMPLANT
SCREW LOCK RELINE 5.5 TULIP (Screw) ×12 IMPLANT
SCREW RELINE-O POLY 6.5X45 (Screw) ×12 IMPLANT
SPONGE SURGIFOAM ABS GEL 100 (HEMOSTASIS) ×2 IMPLANT
SPONGE T-LAP 4X18 ~~LOC~~+RFID (SPONGE) ×4 IMPLANT
SUT PROLENE 6 0 BV (SUTURE) IMPLANT
SUT VIC AB 1 CT1 18XBRD ANBCTR (SUTURE) ×1 IMPLANT
SUT VIC AB 1 CT1 8-18 (SUTURE) ×2
SUT VIC AB 2-0 CP2 18 (SUTURE) ×4 IMPLANT
SUT VIC AB 3-0 SH 8-18 (SUTURE) ×2 IMPLANT
SUT VIC AB 4-0 RB1 18 (SUTURE) ×2 IMPLANT
SYR 3ML LL SCALE MARK (SYRINGE) ×8 IMPLANT
SYR 5ML LL (SYRINGE) IMPLANT
TOWEL GREEN STERILE (TOWEL DISPOSABLE) ×2 IMPLANT
TOWEL GREEN STERILE FF (TOWEL DISPOSABLE) ×2 IMPLANT
TRAY FOLEY MTR SLVR 16FR STAT (SET/KITS/TRAYS/PACK) ×2 IMPLANT
WATER STERILE IRR 1000ML POUR (IV SOLUTION) ×2 IMPLANT

## 2021-01-19 NOTE — Op Note (Signed)
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  Posterior lumbar interbody arthrodesis with peek spacers local autograft allograft and Proteus L4-5 L5-S1.  Posterolateral arthrodesis with local autograft allograft and Proteus L4 to sacrum segmental fixation with pedicle screws L4-L5 and the sacrum with fluoroscopic guidance.  Surgeon: Barnett Abu Anesthesia: General endotracheal Indications: The patient is a 65 year old individual who has been suffering with back pain and lumbar radicular pain secondary to a spondylolisthesis for a number of years.  Recently has become more intractable to conservative management and the patient was advised regarding surgical decompression and stabilization.  She is admitted for that now.  Procedure: Patient was brought to the operating room supine on the stretcher.  After the smooth induction of general endotracheal anesthesia and placement of Foley catheter and appropriate monitoring lines she was carefully turned prone.  The back was prepped with alcohol DuraPrep and draped in a sterile fashion.  Midline incision was made in the lower lumbar spine and carried down to the lumbodorsal fascia.  Fascia was opened on either side of midline to expose L4-L5 and S1 regions.  These were identified positively with 2 radiographs.  Then laminotomies were created at the L4-5 site to remove the inferior margin lamina of L4 out to and including the entirety of the facet joint.  The thickened redundant yellow ligament and hypertrophied facets in these areas were removed and the fascia and cartilage that was overgrown hypertrophied was also removed.  The yellow ligament was taken up and the common dural tube was then explored and the path of the L4 nerve  root superiorly and the L5 nerve root inferiorly were isolated and decompressed.  Then by protecting the dura medially the disc space was identified after doing the right side the left side was similarly decompressed and then a similar fashion L5-S1 was decompressed with the L5 and the S1 nerve roots being individually decompressed and the disc space was isolated.  Next the disc space was entered at L4-5 and a series of rongeurs and curettes and shaving instruments were used to remove the entirety of the disc at L4-5 on both sides medially laterally and under the surface of the ligament.  Disc space spreader was used to help facilitate this and ultimately it was felt that an interbody spacer measuring 12 x 9 x 23 mm in size with 8 degrees of lordosis would fit best in the L4-5 level this along with 12 cc of autograft allograft and Proteus was packed into the interspace along with the 2 cages.  This allowed for good reduction of that spondylolisthesis there next attention was turned to L5-S1 where a similar discectomy was carried out and here an 8 x 9 x 23 mm spacer with 12 degrees lordosis filled the interspace nicely along with 12 cc of bone graft.  Once the areas were grafted and the cages were set nicely pedicle entry sites were chosen at L4-L5 and the sacrum while the lateral recesses were exposed and packed off for grafting from L4 to the sacrum these areas were carefully decorticated and once pedicle entry sites were chosen fluoroscopic guidance was used to guide 6.5 x 45 mm pedicle screws at L4-L5 and the sacrum.  When the screws were placed 60 mm precontoured rods were used to fit between the heads of the screws but prior  to this the bone graft was packed into the lateral gutters a total of 6 cc on each side was packed.  The screws were tightened in a neutral construct and final radiographs identified good alignment of the vertebrae in AP and lateral projections.  Hemostasis in the soft tissues was checked  and then care was taken to make sure that the L4 the L5 and the S1 nerve roots were well decompressed far out into the foramen no bone graft material had migrated into the area of the decompression and hemostasis was achieved in the soft tissues then the lumbodorsal fascia was closed with #1 Vicryl interrupted fashion 2-0 Vicryl was used in subcutaneous tissues 3-0 Vicryl subcuticularly and 4-0 Vicryl with a final subcuticular closure Dermabond was used on the skin and blood loss was estimated 500 cc with 120 cc of Cell Saver blood being returned to the patient.

## 2021-01-19 NOTE — H&P (Signed)
Rebecca George is an 65 y.o. female.   Chief Complaint: Back and bilateral leg pain HPI: Rebecca George is a 65 year old individual whose had a degenerative spondylolisthesis evolving at L4-5 and L5-S1 for the number of years.  She has been treated conservatively during all this time with occasional epidural injections physical therapy and conservative pain management however this past spring she notes that her symptoms have gotten considerably worse plain x-rays and an MRI were repeated which demonstrate that the patient has now evolved a mobile spondylolisthesis at L4-5 with approximately 8 mm of anterolisthesis in flexion at L4-5 3 or 4 mm of movement at L5-S1.  She is developed radicular symptoms that have become increasingly refractory to conservative treatment.  She has been advised regarding surgical decompression and stabilization via posterior interbody technique at L4-5 and L5-S1.  She is now admitted for this procedure.  Past Medical History:  Diagnosis Date   Diabetes mellitus without complication (HCC)    Hypertension     Past Surgical History:  Procedure Laterality Date   ABDOMINAL HYSTERECTOMY     CERVICAL SPINE SURGERY      History reviewed. No pertinent family history. Social History:  reports that she has never smoked. She has never used smokeless tobacco. She reports that she does not drink alcohol and does not use drugs.  Allergies: No Known Allergies  Medications Prior to Admission  Medication Sig Dispense Refill   acetaminophen (TYLENOL) 500 MG tablet Take 1,000 mg by mouth every 6 (six) hours as needed for moderate pain or headache.     irbesartan-hydrochlorothiazide (AVALIDE) 150-12.5 MG tablet Take 1 tablet by mouth daily.     rosuvastatin (CRESTOR) 20 MG tablet Take 20 mg by mouth at bedtime.     SYNJARDY XR 12.09-998 MG TB24 Take 1 tablet by mouth daily.      Results for orders placed or performed during the hospital encounter of 01/19/21 (from the past 48 hour(s))   Glucose, capillary     Status: Abnormal   Collection Time: 01/19/21  6:03 AM  Result Value Ref Range   Glucose-Capillary 146 (H) 70 - 99 mg/dL    Comment: Glucose reference range applies only to samples taken after fasting for at least 8 hours.  ABO/Rh     Status: None (Preliminary result)   Collection Time: 01/19/21  7:15 AM  Result Value Ref Range   ABO/RH(D) PENDING    No results found.  Review of Systems  Constitutional:  Positive for activity change.  HENT: Negative.    Eyes: Negative.   Respiratory: Negative.    Cardiovascular: Negative.   Gastrointestinal: Negative.   Endocrine: Negative.   Genitourinary: Negative.   Musculoskeletal:  Positive for back pain and gait problem.  Neurological:  Positive for weakness and numbness.  Hematological: Negative.   Psychiatric/Behavioral: Negative.     Blood pressure (!) 130/96, pulse 91, temperature 98.1 F (36.7 C), temperature source Oral, resp. rate 17, height 5\' 2"  (1.575 m), weight 85.3 kg, SpO2 96 %. Physical Exam Constitutional:      Appearance: Normal appearance. She is obese.  HENT:     Head: Normocephalic and atraumatic.     Nose: Nose normal.     Mouth/Throat:     Mouth: Mucous membranes are moist.  Eyes:     Extraocular Movements: Extraocular movements intact.     Pupils: Pupils are equal, round, and reactive to light.  Cardiovascular:     Rate and Rhythm: Normal rate and regular rhythm.  Pulses: Normal pulses.     Heart sounds: Normal heart sounds.  Pulmonary:     Effort: Pulmonary effort is normal.     Breath sounds: Normal breath sounds.  Abdominal:     General: Abdomen is flat. Bowel sounds are normal.     Palpations: Abdomen is soft.  Musculoskeletal:     Cervical back: Normal range of motion and neck supple.     Comments: Positive straight leg raising at 45 degrees.  Patrick's maneuver is negative bilaterally.  Skin:    General: Skin is warm and dry.     Capillary Refill: Capillary refill takes  less than 2 seconds.  Neurological:     Mental Status: She is alert.     Comments: Mild weakness in tibialis anterior 4-5 bilaterally.  Gastroc strength appears intact.  Patellar reflexes are diminished in the patellae and the Achilles.  Cranial nerve examination is normal upper extremity strength tone and bulk are normal.  Psychiatric:        Mood and Affect: Mood normal.        Behavior: Behavior normal.        Thought Content: Thought content normal.     Assessment/Plan Spondylolisthesis L4-5 L5-S1 with radiculopathy and neurogenic claudication symptoms.  Plan: Posterior decompression with interbody arthrodesis L4 to the sacrum.  Stefani Dama, MD 01/19/2021, 7:50 AM

## 2021-01-19 NOTE — Progress Notes (Signed)
Orthopedic Tech Progress Note Patient Details:  RACHELL DRUCKENMILLER 1956-05-06 [id number] LSO brace was delivered to patient's room Ortho Devices Type of Ortho Device: Lumbar corsett Ortho Device/Splint Location: Back      Demetria Lightsey E Priscille Shadduck 01/19/2021, 6:40 PM

## 2021-01-19 NOTE — Transfer of Care (Signed)
Immediate Anesthesia Transfer of Care Note  Patient: Rebecca George  Procedure(s) Performed: Lumbar four-five, Lumbar five-Sacral one Posterior lumbar interbody fusion (Back)  Patient Location: PACU  Anesthesia Type:General  Level of Consciousness: drowsy  Airway & Oxygen Therapy: Patient Spontanous Breathing  Post-op Assessment: Report given to RN and Post -op Vital signs reviewed and stable  Post vital signs: Reviewed and stable  Last Vitals:  Vitals Value Taken Time  BP 167/110 01/19/21 1249  Temp    Pulse 115 01/19/21 1249  Resp 49 01/19/21 1249  SpO2 99 % 01/19/21 1249  Vitals shown include unvalidated device data.  Last Pain:  Vitals:   01/19/21 0615  TempSrc:   PainSc: 5       Patients Stated Pain Goal: 4 (01/19/21 0615)  Complications: No notable events documented.

## 2021-01-19 NOTE — Anesthesia Procedure Notes (Signed)
Procedure Name: Intubation Date/Time: 01/19/2021 8:05 AM Performed by: Clearnce Sorrel, CRNA Pre-anesthesia Checklist: Patient identified, Emergency Drugs available, Suction available and Patient being monitored Patient Re-evaluated:Patient Re-evaluated prior to induction Oxygen Delivery Method: Circle System Utilized Preoxygenation: Pre-oxygenation with 100% oxygen Induction Type: IV induction Ventilation: Mask ventilation without difficulty Laryngoscope Size: Mac and 3 Grade View: Grade III Tube type: Oral Tube size: 7.0 mm Number of attempts: 1 Airway Equipment and Method: Stylet and Oral airway Placement Confirmation: positive ETCO2 and breath sounds checked- equal and bilateral Secured at: 22 cm Tube secured with: Tape Dental Injury: Teeth and Oropharynx as per pre-operative assessment

## 2021-01-19 NOTE — Anesthesia Postprocedure Evaluation (Signed)
Anesthesia Post Note  Patient: Rebecca George  Procedure(s) Performed: Lumbar four-five, Lumbar five-Sacral one Posterior lumbar interbody fusion (Back)     Patient location during evaluation: PACU Anesthesia Type: General Level of consciousness: awake and alert Pain management: pain level controlled Vital Signs Assessment: post-procedure vital signs reviewed and stable Respiratory status: spontaneous breathing, nonlabored ventilation, respiratory function stable and patient connected to nasal cannula oxygen Cardiovascular status: blood pressure returned to baseline and stable Postop Assessment: no apparent nausea or vomiting Anesthetic complications: no   No notable events documented.  Last Vitals:  Vitals:   01/19/21 1337 01/19/21 1352  BP: 137/90 (!) 114/51  Pulse: (!) 105 (!) 103  Resp: 17 19  Temp:    SpO2: 98% 98%    Last Pain:  Vitals:   01/19/21 1252  TempSrc:   PainSc: Asleep                 Micholas Drumwright S

## 2021-01-20 DIAGNOSIS — E119 Type 2 diabetes mellitus without complications: Secondary | ICD-10-CM | POA: Diagnosis present

## 2021-01-20 DIAGNOSIS — I10 Essential (primary) hypertension: Secondary | ICD-10-CM | POA: Diagnosis present

## 2021-01-20 DIAGNOSIS — Z79899 Other long term (current) drug therapy: Secondary | ICD-10-CM | POA: Diagnosis not present

## 2021-01-20 DIAGNOSIS — M79605 Pain in left leg: Secondary | ICD-10-CM | POA: Diagnosis present

## 2021-01-20 DIAGNOSIS — M48061 Spinal stenosis, lumbar region without neurogenic claudication: Secondary | ICD-10-CM | POA: Diagnosis present

## 2021-01-20 DIAGNOSIS — M4316 Spondylolisthesis, lumbar region: Secondary | ICD-10-CM | POA: Diagnosis present

## 2021-01-20 DIAGNOSIS — M5416 Radiculopathy, lumbar region: Secondary | ICD-10-CM | POA: Diagnosis present

## 2021-01-20 LAB — CBC
HCT: 34.9 % — ABNORMAL LOW (ref 36.0–46.0)
Hemoglobin: 11.5 g/dL — ABNORMAL LOW (ref 12.0–15.0)
MCH: 29.7 pg (ref 26.0–34.0)
MCHC: 33 g/dL (ref 30.0–36.0)
MCV: 90.2 fL (ref 80.0–100.0)
Platelets: 245 10*3/uL (ref 150–400)
RBC: 3.87 MIL/uL (ref 3.87–5.11)
RDW: 13.2 % (ref 11.5–15.5)
WBC: 10.8 10*3/uL — ABNORMAL HIGH (ref 4.0–10.5)
nRBC: 0 % (ref 0.0–0.2)

## 2021-01-20 LAB — BASIC METABOLIC PANEL
Anion gap: 12 (ref 5–15)
BUN: 16 mg/dL (ref 8–23)
CO2: 26 mmol/L (ref 22–32)
Calcium: 9.9 mg/dL (ref 8.9–10.3)
Chloride: 96 mmol/L — ABNORMAL LOW (ref 98–111)
Creatinine, Ser: 1.16 mg/dL — ABNORMAL HIGH (ref 0.44–1.00)
GFR, Estimated: 52 mL/min — ABNORMAL LOW (ref >60)
Glucose, Bld: 138 mg/dL — ABNORMAL HIGH (ref 70–99)
Potassium: 4 mmol/L (ref 3.5–5.1)
Sodium: 134 mmol/L — ABNORMAL LOW (ref 135–145)

## 2021-01-20 LAB — GLUCOSE, CAPILLARY: Glucose-Capillary: 139 mg/dL — ABNORMAL HIGH (ref 70–99)

## 2021-01-20 MED ORDER — METHOCARBAMOL 500 MG PO TABS: 500.0000 mg | ORAL_TABLET | Freq: Four times a day (QID) | ORAL | 3 refills | Status: DC | PRN

## 2021-01-20 MED ORDER — HYDROCODONE-ACETAMINOPHEN 5-325 MG PO TABS: 1.0000 | ORAL_TABLET | ORAL | 0 refills | Status: DC | PRN

## 2021-01-20 NOTE — Discharge Instructions (Addendum)
Wound Care Remove outer dressing in 3 days and leave it off  Leave incision open to air. You may shower. Do not scrub directly on incision.  Do not put any creams, lotions, or ointments on incision.  Activity Walk each and every day, increasing distance each day. No lifting greater than 5 lbs.  Avoid excessive neck motion. No driving for 2 weeks; may ride as a passenger locally.  Diet Resume your normal diet.   Your Doctor If Any of These Occur Redness, drainage, or swelling at the wound.  Temperature greater than 101 degrees. Severe pain not relieved by pain medication. Increased difficulty swallowing. Incision starts to come apart. Follow Up Appt Call today for appointment in 3 weeks ([phone]) or for problems.  If you have any hardware placed in your spine, you will need an x-ray before your appointment.

## 2021-01-20 NOTE — Evaluation (Signed)
Physical Therapy Evaluation Patient Details Name: Rebecca George MRN: [id number] DOB: March 12, 1956 Today's Date: 01/20/2021  History of Present Illness  65 y.o. female presents to [facility] hospital on 01/19/2021 with back and BLE pain. Pt imaging reveals a mobile spondylolisthesis at L4-5 with approximately 8 mm of anterolisthesis in flexion at L4-5 3 or 4 mm of movement at L5-S1. Pt underwent L4-S1 PLIF on 01/19/2021. PMH includes HTN and miabetes mellitus.  Clinical Impression  Pt presents to PT with deficits in balance, gait, power. Pt is able to ambulate and transfer without physical assistance. Pt demonstrates the ability to don brace independently. Pt is independent at baseline and demonstrates deficits in higher level gait/balance tasks currently. Pt will benefit from continued acute PT services to aide in a return to independence.     Recommendations for follow up therapy are one component of a multi-disciplinary discharge planning process, led by the attending physician.  Recommendations may be updated based on patient status, additional functional criteria and insurance authorization.  Follow Up Recommendations No PT follow up    Equipment Recommendations  None recommended by PT    Recommendations for Other Services       Precautions / Restrictions Precautions Precautions: Back Precaution Booklet Issued: Yes (comment) Required Braces or Orthoses: Spinal Brace Spinal Brace: Lumbar corset;Applied in sitting position Restrictions Weight Bearing Restrictions: No      Mobility  Bed Mobility               General bed mobility comments: pt received and left in recliner, PT provides education on log roll technique to maintain back precautions    Transfers Overall transfer level: Independent                  Ambulation/Gait Ambulation/Gait assistance: Supervision Gait Distance (Feet): 200 Feet Assistive device: None Gait Pattern/deviations: Step-through pattern Gait  velocity: functional Gait velocity interpretation: 1.31 - 2.62 ft/sec, indicative of limited community ambulator General Gait Details: pt with steady step-through gait  Stairs            Wheelchair Mobility    Modified Rankin (Stroke Patients Only)       Balance Overall balance assessment: Mild deficits observed, not formally tested                                           Pertinent Vitals/Pain Pain Assessment: Faces Faces Pain Scale: Hurts little more Pain Location: low back Pain Descriptors / Indicators: Sore Pain Intervention(s): Monitored during session    Home Living Family/patient expects to be discharged to:: Private residence Living Arrangements: Alone Available Help at Discharge: Family;Available 24 hours/day (temporary 24/7 from family) Type of Home: Apartment Home Access: Level entry     Home Layout: One level Home Equipment: None      Prior Function Level of Independence: Independent               Hand Dominance        Extremity/Trunk Assessment   Upper Extremity Assessment Upper Extremity Assessment: Overall WFL for tasks assessed    Lower Extremity Assessment Lower Extremity Assessment: Overall WFL for tasks assessed    Cervical / Trunk Assessment Cervical / Trunk Assessment: Other exceptions Cervical / Trunk Exceptions: s/p lumbar surgery  Communication   Communication: No difficulties  Cognition Arousal/Alertness: Awake/alert Behavior During Therapy: WFL for tasks assessed/performed Overall Cognitive Status: Within Functional  Limits for tasks assessed                                        General Comments General comments (skin integrity, edema, etc.): VSS on RA    Exercises     Assessment/Plan    PT Assessment Patient needs continued PT services  PT Problem List Decreased balance;Decreased activity tolerance;Decreased mobility       PT Treatment Interventions DME  instruction;Gait training;Functional mobility training;Therapeutic activities;Therapeutic exercise;Balance training;Neuromuscular re-education;Patient/family education    PT Goals (Current goals can be found in the Care Plan section)  Acute Rehab PT Goals Patient Stated Goal: to go home PT Goal Formulation: With patient Time For Goal Achievement: 02/03/21 Potential to Achieve Goals: Good    Frequency Min 5X/week   Barriers to discharge        Co-evaluation               AM-PAC PT "6 Clicks" Mobility  Outcome Measure Help needed turning from your back to your side while in a flat bed without using bedrails?: A Little Help needed moving from lying on your back to sitting on the side of a flat bed without using bedrails?: A Little Help needed moving to and from a bed to a chair (including a wheelchair)?: None Help needed standing up from a chair using your arms (e.g., wheelchair or bedside chair)?: None Help needed to walk in hospital room?: A Little Help needed climbing 3-5 steps with a railing? : A Little 6 Click Score: 20    End of Session   Activity Tolerance: Patient tolerated treatment well Patient left: in chair;with call Derner/phone within reach Nurse Communication: Mobility status PT Visit Diagnosis: Other abnormalities of gait and mobility (R26.89)    Time: 8119-1478 PT Time Calculation (min) (ACUTE ONLY): 13 min   Charges:   PT Evaluation $PT Eval Low Complexity: 1 Low          Arlyss Gandy, PT, DPT Acute Rehabilitation Pager: [phone]   Arlyss Gandy 01/20/2021, 8:54 AM

## 2021-01-20 NOTE — Evaluation (Signed)
Occupational Therapy Evaluation Patient Details Name: Rebecca George MRN: [id number] DOB: Jun 29, 1955 Today's Date: 01/20/2021   History of Present Illness 66 y.o. female presents to [facility] hospital on 01/19/2021 with back and BLE pain. Pt imaging reveals a mobile spondylolisthesis at L4-5 with approximately 8 mm of anterolisthesis in flexion at L4-5 3 or 4 mm of movement at L5-S1. Pt underwent L4-S1 PLIF on 01/19/2021. PMH includes HTN and miabetes mellitus.   Clinical Impression   Patient admitted for the diagnosis and procedure above.  PTA she lives alone, but has a SO that can assist PRN.  Deficit is expected discomfort post surgery, but she is close to her baseline for in room mobility and ADL completion from a sit/stand level.  All questions answered, and no further acute or post acute OT needs identified.       Recommendations for follow up therapy are one component of a multi-disciplinary discharge planning process, led by the attending physician.  Recommendations may be updated based on patient status, additional functional criteria and insurance authorization.   Follow Up Recommendations  No OT follow up    Equipment Recommendations  None recommended by OT    Recommendations for Other Services       Precautions / Restrictions Precautions Precautions: Back Precaution Booklet Issued: Yes (comment) Required Braces or Orthoses: Spinal Brace Spinal Brace: Lumbar corset;Applied in sitting position;Applied in standing position Restrictions Weight Bearing Restrictions: No      Mobility Bed Mobility               General bed mobility comments: pt received and left in recliner, PT provides education on log roll technique to maintain back precautions Patient Response: Cooperative  Transfers Overall transfer level: Independent                    Balance Overall balance assessment: Mild deficits observed, not formally tested                                          ADL either performed or assessed with clinical judgement   ADL Overall ADL's : At baseline                                             Vision Patient Visual Report: No change from baseline       Perception     Praxis      Pertinent Vitals/Pain Pain Assessment: Faces Faces Pain Scale: Hurts little more Pain Location: low back Pain Descriptors / Indicators: Sore Pain Intervention(s): Monitored during session     Hand Dominance Right   Extremity/Trunk Assessment Upper Extremity Assessment Upper Extremity Assessment: Overall WFL for tasks assessed   Lower Extremity Assessment Lower Extremity Assessment: Overall WFL for tasks assessed   Cervical / Trunk Assessment Cervical / Trunk Assessment: Other exceptions Cervical / Trunk Exceptions: s/p lumbar surgery   Communication Communication Communication: No difficulties   Cognition Arousal/Alertness: Awake/alert Behavior During Therapy: WFL for tasks assessed/performed Overall Cognitive Status: Within Functional Limits for tasks assessed                                     General Comments  VSS on  RA               Home Living Family/patient expects to be discharged to:: Private residence Living Arrangements: Alone Available Help at Discharge: Family;Available 24 hours/day Type of Home: Apartment Home Access: Level entry     Home Layout: One level     Bathroom Shower/Tub: Chief Strategy Officer: Standard     Home Equipment: None          Prior Functioning/Environment Level of Independence: Independent                 OT Problem List: Impaired balance (sitting and/or standing);Pain      OT Treatment/Interventions:      OT Goals(Current goals can be found in the care plan section) Acute Rehab OT Goals Patient Stated Goal: to go home OT Goal Formulation: With patient Time For Goal Achievement: 01/20/21 Potential to Achieve Goals: Good   OT Frequency:     Barriers to D/C:  none          Co-evaluation              AM-PAC OT "6 Clicks" Daily Activity     Outcome Measure Help from another person eating meals?: None Help from another person taking care of personal grooming?: None Help from another person toileting, which includes using toliet, bedpan, or urinal?: None Help from another person bathing (including washing, rinsing, drying)?: None Help from another person to put on and taking off regular upper body clothing?: None Help from another person to put on and taking off regular lower body clothing?: None 6 Click Score: 24   End of Session    Activity Tolerance: Patient tolerated treatment well Patient left: in chair;with call Bourquin/phone within reach  OT Visit Diagnosis: Unsteadiness on feet (R26.81);Pain                Time: 0810-0828 OT Time Calculation (min): 18 min Charges:  OT General Charges $OT Visit: 1 Visit OT Evaluation $OT Eval Moderate Complexity: 1 Mod  01/20/2021  RP, OTR/L  Acute Rehabilitation Services  Office:  [phone]   Suzanna Obey 01/20/2021, 10:40 AM

## 2021-01-20 NOTE — Plan of Care (Signed)
WNL

## 2021-01-20 NOTE — Progress Notes (Signed)
Patient alert and oriented, voiding adequately with no c/o pain at the time of discharge. Patient discharge home per order. Discharge instructions given to patient and friend with stated understanding of all instructions. Patient has an appointment with MD in 2 weeks

## 2021-01-20 NOTE — Discharge Summary (Signed)
Physician Discharge Summary  Patient ID: Rebecca George MRN: [id number] DOB/AGE: 66/66/1957 66 y.o.  Admit date: 01/19/2021 Discharge date: 01/20/2021  Admission Diagnoses: Spondylolisthesis L4-L5 with lumbar radiculopathy  Discharge Diagnoses: Spondylolisthesis L4-5 with lumbar radiculopathy Active Problems:   Spondylolisthesis of lumbar region   Spondylolisthesis at L4-L5 level   Discharged Condition: good  Hospital Course: Patient was admitted for surgery which she tolerated well.  Consults: None  Significant Diagnostic Studies: None  Treatments: surgery: See dictation  Discharge Exam: Blood pressure 122/89, pulse (!) 105, temperature 98.2 F (36.8 C), temperature source Oral, resp. rate 18, height 5\' 2"  (1.575 m), weight 85.3 kg, SpO2 95 %. Incision is clean and dry Station and gait are intact motor strength is intact  Disposition: Discharge disposition: 01-Home or Self Care       Discharge Instructions     Call MD for:  redness, tenderness, or signs of infection (pain, swelling, redness, odor or green/yellow discharge around incision site)   Complete by: As directed    Call MD for:  severe uncontrolled pain   Complete by: As directed    Call MD for:  temperature >100.4   Complete by: As directed    Diet - low sodium heart healthy   Complete by: As directed    Diet Carb Modified   Complete by: As directed    Discharge wound care:   Complete by: As directed    Okay to shower. Do not apply salves or appointments to incision. No heavy lifting with the upper extremities greater than 10 pounds. May resume driving when not requiring pain medication and patient feels comfortable with doing so.   Incentive spirometry RT   Complete by: As directed    Increase activity slowly   Complete by: As directed       Allergies as of 01/20/2021   No Known Allergies      Medication List     TAKE these medications    acetaminophen 500 MG tablet Commonly known as:  TYLENOL Take 1,000 mg by mouth every 6 (six) hours as needed for moderate pain or headache.   HYDROcodone-acetaminophen 5-325 MG tablet Commonly known as: Norco Take 1-2 tablets by mouth every 4 (four) hours as needed for moderate pain or severe pain.   irbesartan-hydrochlorothiazide 150-12.5 MG tablet Commonly known as: AVALIDE Take 1 tablet by mouth daily.   methocarbamol 500 MG tablet Commonly known as: ROBAXIN Take 1 tablet (500 mg total) by mouth every 6 (six) hours as needed for muscle spasms.   rosuvastatin 20 MG tablet Commonly known as: CRESTOR Take 20 mg by mouth at bedtime.   Synjardy XR 12.09-998 MG Tb24 Generic drug: Empagliflozin-metFORMIN HCl ER Take 1 tablet by mouth daily.               Discharge Care Instructions  (From admission, onward)           Start     Ordered   01/20/21 0000  Discharge wound care:       Comments: Okay to shower. Do not apply salves or appointments to incision. No heavy lifting with the upper extremities greater than 10 pounds. May resume driving when not requiring pain medication and patient feels comfortable with doing so.   01/20/21 1016             Signed: 01/22/21 Jonothan Heberle 01/20/2021, 10:18 AM

## 2021-01-25 MED FILL — Sodium Chloride IV Soln 0.9%: INTRAVENOUS | Qty: 1000 | Status: AC

## 2021-01-25 MED FILL — Heparin Sodium (Porcine) Inj 1000 Unit/ML: INTRAMUSCULAR | Qty: 30 | Status: AC

## 2021-01-25 MED FILL — Sodium Chloride Irrigation Soln 0.9%: Qty: 3000 | Status: AC

## 2021-09-27 ENCOUNTER — Ambulatory Visit (HOSPITAL_COMMUNITY)
Admission: EM | Admit: 2021-09-27 | Discharge: 2021-09-27 | Disposition: A | Discharge status: Home / Self Care | Payer: 59 | Attending: Family Medicine | Admitting: Family Medicine

## 2021-09-27 ENCOUNTER — Encounter (HOSPITAL_COMMUNITY): Payer: Self-pay

## 2021-09-27 DIAGNOSIS — S39012A Strain of muscle, fascia and tendon of lower back, initial encounter: Secondary | ICD-10-CM

## 2021-09-27 DIAGNOSIS — S161XXA Strain of muscle, fascia and tendon at neck level, initial encounter: Secondary | ICD-10-CM

## 2021-09-27 DIAGNOSIS — I10 Essential (primary) hypertension: Secondary | ICD-10-CM

## 2021-09-27 MED ORDER — NAPROXEN 375 MG PO TABS: 375.0000 mg | ORAL_TABLET | Freq: Two times a day (BID) | ORAL | 0 refills | Status: AC

## 2021-09-27 MED ORDER — HYDROCODONE-ACETAMINOPHEN 5-325 MG PO TABS: 1.0000 | ORAL_TABLET | Freq: Four times a day (QID) | ORAL | 0 refills | Status: AC | PRN

## 2021-09-27 MED ORDER — HYDROCODONE-ACETAMINOPHEN 5-325 MG PO TABS: 1.0000 | ORAL_TABLET | Freq: Four times a day (QID) | ORAL | 0 refills | Status: DC | PRN

## 2021-09-27 MED ORDER — NAPROXEN 375 MG PO TABS: 375.0000 mg | ORAL_TABLET | Freq: Two times a day (BID) | ORAL | 0 refills | Status: DC
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
## 2021-09-27 NOTE — Discharge Instructions (Addendum)
Be aware, you have been prescribed pain medications that may cause drowsiness. While taking this medication, do not take any other medications containing acetaminophen (Tylenol). Do not combine with alcohol or other illicit drugs. Please do not drive, operate heavy machinery, or take part in activities that require making important decisions while on this medication as your judgement may be clouded.  Your blood pressure was noted to be elevated during your visit today. If you are currently taking medication for high blood pressure, please ensure you are taking this as directed. If you do not have a history of high blood pressure and your blood pressure remains persistently elevated, you may need to begin taking a medication at some point. You may return here within the next few days to recheck if unable to see your primary care provider or if you do not have a one.  BP (!) 171/107 (BP Location: Left Arm) Comment: BP meds not taken today  Pulse 96   Temp 98.1 F (36.7 C) (Oral)   Resp 18   SpO2 95%   BP Readings from Last 3 Encounters:  09/27/21 (!) 171/107  01/20/21 122/89  01/16/21 (!) 149/103

## 2023-02-09 IMAGING — CR DG LUMBAR SPINE 2-3V
2 series · 2 of 2 positions shown · non-contrast
Comparison: Lumbar spine MRI 09/25/2020.

CLINICAL DATA: Surgery, elective. Additional history provided:
Location images for lumbar 4-5, lumbar 5-sacral 1 posterior lumbar
interbody fusion for spondylolisthesis.

EXAM:
LUMBAR SPINE - 2-3 VIEW

[lateral (1 of 2)]
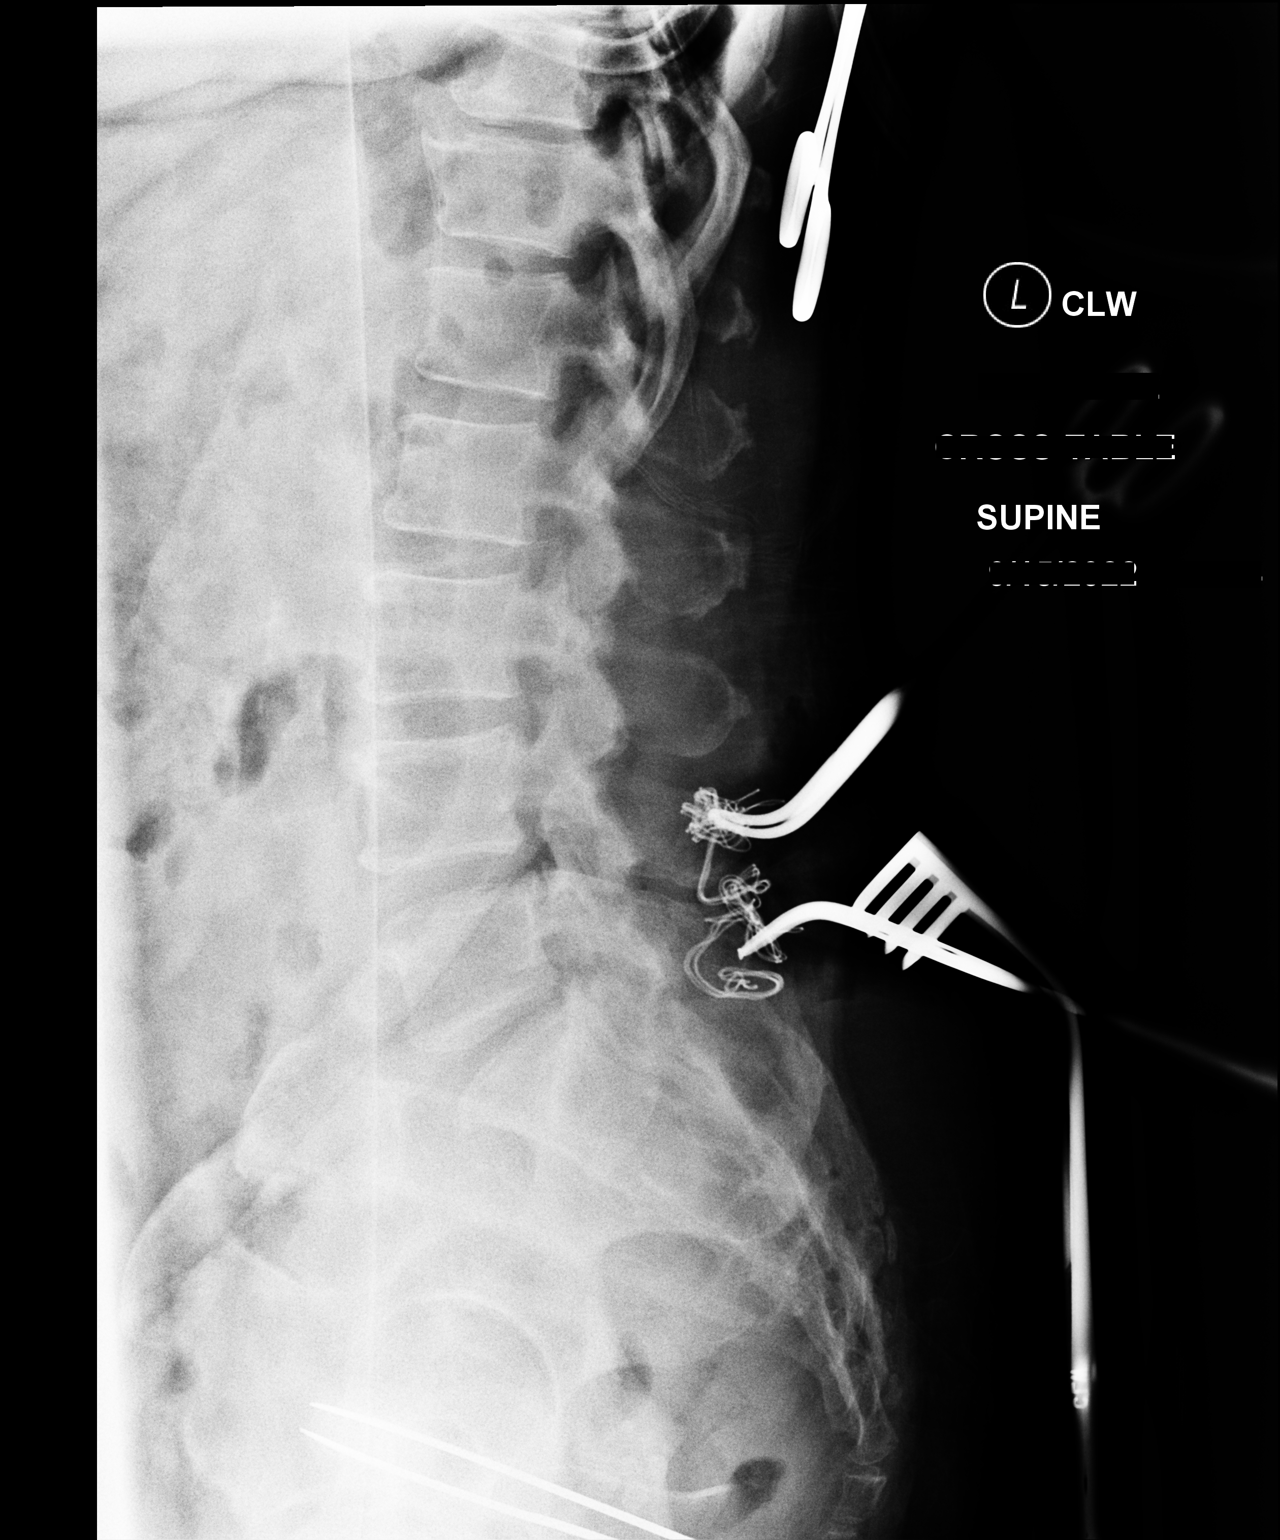

[lateral (2 of 2)]
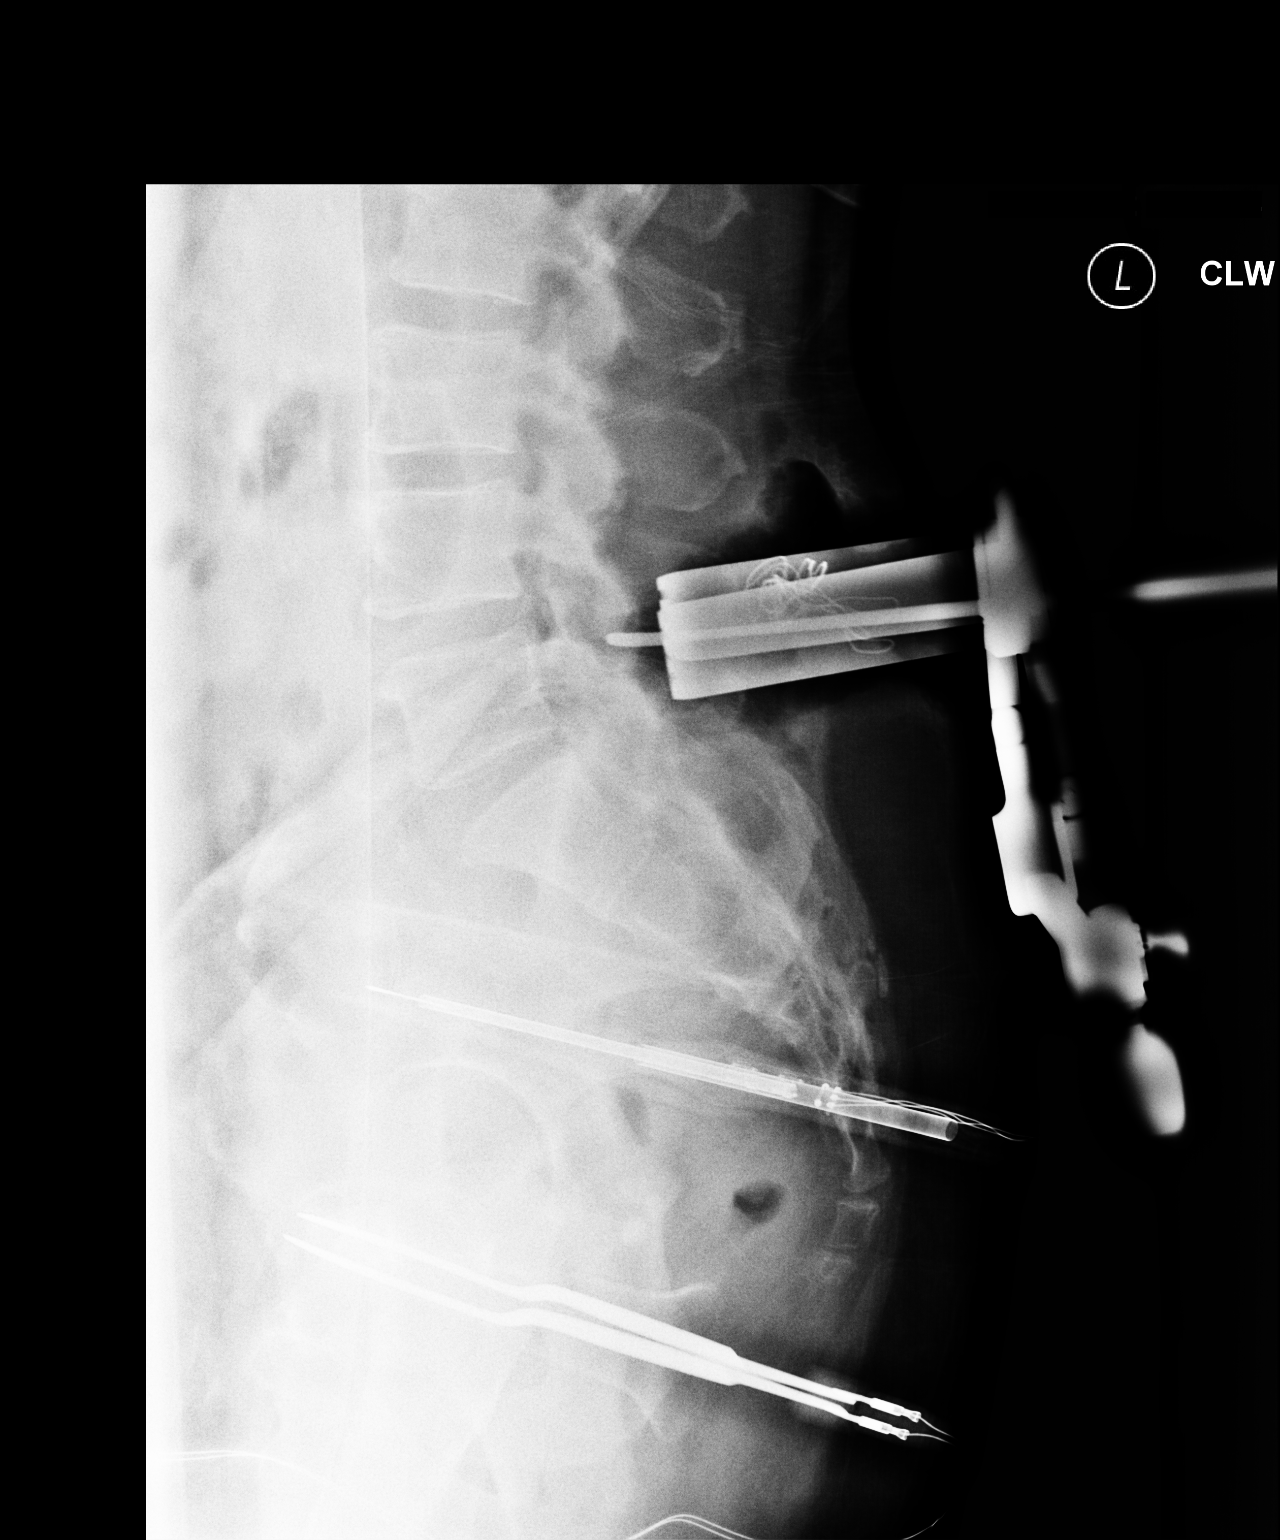

[2 of 2 positions shown; findings below may reference images not displayed]

FINDINGS: Two intraoperative lateral view radiographs of the lumbosacral spine
are submitted. The lowest well-formed intervertebral disc space is
designated L5-S1 (this is consistent with spinal numbering utilized
on the lumbar spine MRI of 09/25/2020). On the initial provided
image acquired at [DATE] a.m., surgical instruments and retractors
project posterior to the lumbar spine at the L4-L5 and L5-S1 levels.
Curvilinear hyperdensity also present posterior to the lumbar spine
at these levels, likely reflecting surgical sponges/packing
material. On the subsequent provided image, acquired at [DATE] a.m.
surgical instruments and retractors project posterior to the lumbar
spine at the L4-L5 level. Curvilinear hyperdensity again projects
posterior to the lumbar spine at this level, likely reflecting
surgical sponges/packing material. Correlate with the operative
history.
IMPRESSION: Two lateral view intraoperative radiographs of the lumbosacral
spine, as described. Correlate with the operative history.

## 2023-02-09 IMAGING — RF DG LUMBAR SPINE 2-3V
1 series · 2 of 2 positions shown · non-contrast
Comparison: Intraoperative localizer radiographs of the lumbar
spine acquired earlier today 01/19/2021. Lumbar spine MRI
09/25/2020.

CLINICAL DATA: Surgery, elective. Additional history provided:
L4-S1 PLIF for spondylolisthesis. Provided fluoroscopy time 33
seconds (30.07 mGy).

EXAM:
LUMBAR SPINE - 2-3 VIEW

[Series 1: run · 2 of 2 slices shown]
[im 1/2]
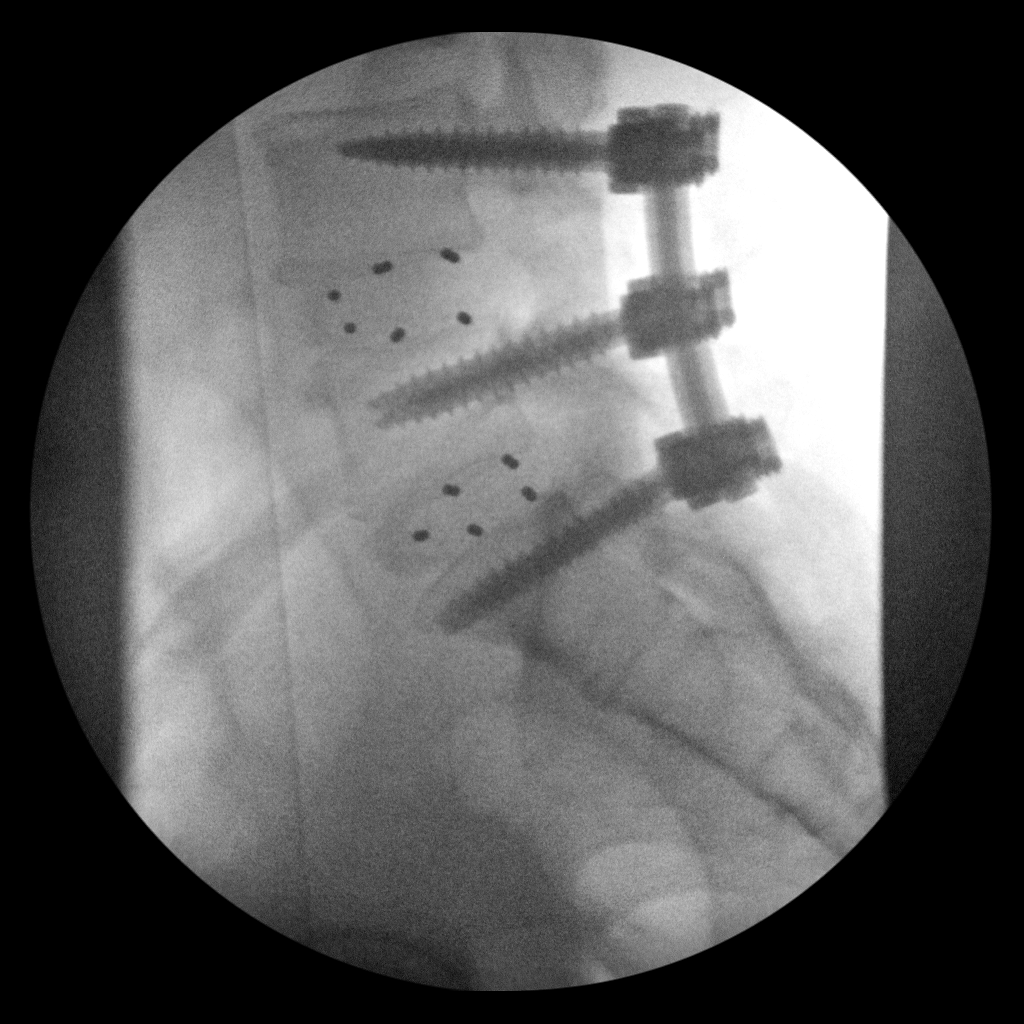
[im 2/2]
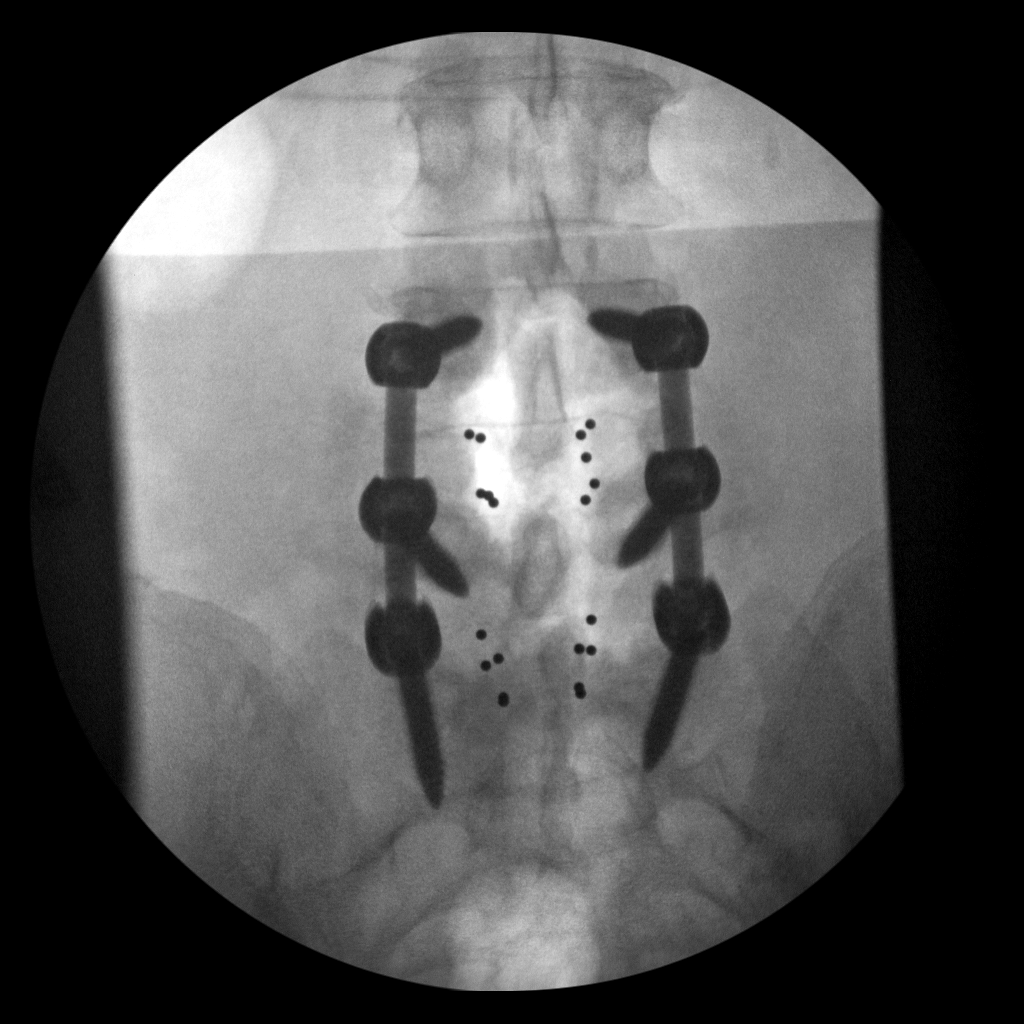

[2 of 2 positions shown; findings below may reference images not displayed]

FINDINGS: AP and lateral view intraoperative fluoroscopic images of the
lumbosacral spine are submitted, 2 images total. The lowest
well-formed intervertebral disc space is designated L5-S1. On the
provided images, a posterior spinal fusion construct spans the L4-S1
levels (bilateral pedicle screws and vertical interconnecting rods).
Interbody devices are also present at L4-L5 and L5-S1.
IMPRESSION: Two intraoperative fluoroscopic images of the lumbosacral spine from
reported L4-S1 PLIF, as described.

## 2023-09-23 ENCOUNTER — Encounter: Payer: Self-pay | Admitting: Skilled Nursing Facility1

## 2023-09-23 ENCOUNTER — Encounter: Attending: Internal Medicine | Admitting: Skilled Nursing Facility1

## 2023-09-23 DIAGNOSIS — E119 Type 2 diabetes mellitus without complications: Secondary | ICD-10-CM | POA: Insufficient documentation

## 2023-09-23 NOTE — Progress Notes (Signed)
 A1C 9%  Pt states she cannot sleep at night and so cannot be active during the day.  Pt states she works full time but her boyfriend is retired whom lives in [place] . Pt states she stopped taking the Synjardy  because she felt she was doing well and did not need it stating she has not really gotten back to taking it as prescribed.  Pt state she does not check her blood sugars.  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    DM medications: Synjardy   Other Diagnosis: HTN  Diabetes Self-Management Education  Visit Type: First/Initial  Appt. Start Time: 7:20 Appt. End Time: 8:25  09/23/2023  Rebecca George, identified by name and date of birth, is a 68 y.o. female with a diagnosis of Diabetes: Type 2.   ASSESSMENT  There were no vitals taken for this visit. There is no height or weight on file to calculate BMI.   Diabetes Self-Management Education - 09/23/23 0732       Visit Information   Visit Type First/Initial      Initial Visit   Diabetes Type Type 2    Are you currently following a meal plan? No    Are you taking your medications as prescribed? No      Health Coping   How would you rate your overall health? Good      Psychosocial Assessment   Patient Belief/Attitude about Diabetes Motivated to manage diabetes    What is the hardest part about your diabetes right now, causing you the most concern, or is the most worrisome to you about your diabetes?   Making healty food and beverage choices    Self-care barriers None    Self-management support Family    Special Needs None    Preferred Learning Style Visual;Auditory    Learning Readiness Not Ready    How often do you need to have someone help you when you read instructions, pamphlets, or other written materials from your doctor or pharmacy? 1 - Never      Pre-Education Assessment   Patient understands the diabetes disease and treatment process. Needs Instruction    Patient understands  incorporating nutritional management into lifestyle. Needs Instruction    Patient undertands incorporating physical activity into lifestyle. Needs Instruction    Patient understands using medications safely. Needs Instruction    Patient understands monitoring blood glucose, interpreting and using results Needs Instruction    Patient understands prevention, detection, and treatment of acute complications. Needs Instruction    Patient understands prevention, detection, and treatment of chronic complications. Needs Instruction    Patient understands how to develop strategies to address psychosocial issues. Needs Instruction    Patient understands how to develop strategies to promote health/change behavior. Needs Instruction      Complications   Last HgB A1C per patient/outside source 9 %    How often do you check your blood sugar? 0 times/day (not testing)    Number of hypoglycemic episodes per month 0    Have you had a dilated eye exam in the past 12 months? No    Have you had a dental exam in the past 12 months? Yes    Are you checking your feet? Yes    How many days per week are you checking your feet? 3      Dietary Intake   Breakfast half a glucerna or cottage cheese and 1/2 banana    Snack (morning) fruit and chips  Lunch fast food    Snack (afternoon) crackers and chips    Dinner peanut butter jelly sandwich or pork chop sandwich    Snack (evening) nuts or cookies and chips  in the middle of the night      Activity / Exercise   Activity / Exercise Type ADL's    How many days per week do you exercise? 0    How many minutes per day do you exercise? 0    Total minutes per week of exercise 0      Patient Education   Previous Diabetes Education No    Disease Pathophysiology Definition of diabetes, type 1 and 2, and the diagnosis of diabetes;Factors that contribute to the development of diabetes    Healthy Eating Food label reading, portion sizes and measuring food.;Plate  Method;Carbohydrate counting;Reviewed blood glucose goals for pre and post meals and how to evaluate the patients' food intake on their blood glucose level.;Information on hints to eating out and maintain blood glucose control.;Meal timing in regards to the patients' current diabetes medication.    Being Active Role of exercise on diabetes management, blood pressure control and cardiac health.;Helped patient identify appropriate exercises in relation to his/her diabetes, diabetes complications and other health issue.    Medications Reviewed patients medication for diabetes, action, purpose, timing of dose and side effects.;Reviewed medication adjustment guidelines for hyperglycemia and sick days.    Monitoring Yearly dilated eye exam;Daily foot exams;Identified appropriate SMBG and/or A1C goals.;Purpose and frequency of SMBG.;Taught/evaluated SMBG meter.;Taught/evaluated CGM (comment)    Acute complications Discussed and identified patients' prevention, symptoms, and treatment of hyperglycemia.;Taught prevention, symptoms, and  treatment of hypoglycemia - the 15 rule.    Chronic complications Dental care;Retinopathy and reason for yearly dilated eye exams;Nephropathy, what it is, prevention of, the use of ACE, ARB's and early detection of through urine microalbumia.;Lipid levels, blood glucose control and heart disease    Diabetes Stress and Support Role of stress on diabetes;Identified and addressed patients feelings and concerns about diabetes      Individualized Goals (developed by patient)   Nutrition General guidelines for healthy choices and portions discussed;Follow meal plan discussed    Physical Activity Exercise 5-7 days per week;15 minutes per day;30 minutes per day    Medications take my medication as prescribed    Monitoring  Test blood glucose pre and post meals as discussed;Test my blood glucose as discussed    Problem Solving Sleep Pattern;Eating Pattern    Reducing Risk treat  hypoglycemia with 15 grams of carbs if blood glucose less than 70mg /dL;do foot checks daily      Post-Education Assessment   Patient understands the diabetes disease and treatment process. Demonstrates understanding / competency    Patient understands incorporating nutritional management into lifestyle. Demonstrates understanding / competency    Patient undertands incorporating physical activity into lifestyle. Demonstrates understanding / competency    Patient understands using medications safely. Demonstrates understanding / competency    Patient understands monitoring blood glucose, interpreting and using results Demonstrates understanding / competency    Patient understands prevention, detection, and treatment of acute complications. Demonstrates understanding / competency    Patient understands prevention, detection, and treatment of chronic complications. Demonstrates understanding / competency    Patient understands how to develop strategies to address psychosocial issues. Demonstrates understanding / competency    Patient understands how to develop strategies to promote health/change behavior. Demonstrates understanding / competency      Outcomes   Expected Outcomes Demonstrated limited interest  in learning.  Expect minimal changes    Future DMSE PRN    Program Status Completed             Individualized Plan for Diabetes Self-Management Training:   Learning Objective:  Patient will have a greater understanding of diabetes self-management. Patient education plan is to attend individual and/or group sessions per assessed needs and concerns.    Education material provided: ADA - How to Thrive: A Guide for Your Journey with Diabetes, Food label handouts, Meal plan card, My Plate, Snack sheet, and No sodium seasonings  If problems or questions, patient to contact team via:  Phone and Email  Future DSME appointment: PRN
